# Patient Record
Sex: Female | Born: 1970 | Race: White | Hispanic: No | Marital: Married | Smoking: Never smoker
Health system: Southern US, Community
[De-identification: ages and names within clinical notes are randomized; demographics above are authoritative.]

## PROBLEM LIST (undated history)

## (undated) DIAGNOSIS — M1990 Unspecified osteoarthritis, unspecified site: Secondary | ICD-10-CM

## (undated) DIAGNOSIS — D649 Anemia, unspecified: Secondary | ICD-10-CM

## (undated) DIAGNOSIS — K219 Gastro-esophageal reflux disease without esophagitis: Secondary | ICD-10-CM

## (undated) DIAGNOSIS — D497 Neoplasm of unspecified behavior of endocrine glands and other parts of nervous system: Secondary | ICD-10-CM

## (undated) DIAGNOSIS — G4730 Sleep apnea, unspecified: Secondary | ICD-10-CM

## (undated) DIAGNOSIS — E049 Nontoxic goiter, unspecified: Secondary | ICD-10-CM

## (undated) DIAGNOSIS — E66813 Obesity, class 3: Secondary | ICD-10-CM

## (undated) DIAGNOSIS — J189 Pneumonia, unspecified organism: Secondary | ICD-10-CM

## (undated) DIAGNOSIS — R011 Cardiac murmur, unspecified: Secondary | ICD-10-CM

## (undated) HISTORY — PX: TUBAL LIGATION: SHX77

## (undated) HISTORY — DX: Sleep apnea, unspecified: G47.30

## (undated) HISTORY — DX: Morbid (severe) obesity due to excess calories: E66.01

## (undated) HISTORY — DX: Gastro-esophageal reflux disease without esophagitis: K21.9

## (undated) HISTORY — DX: Anemia, unspecified: D64.9

## (undated) HISTORY — DX: Unspecified osteoarthritis, unspecified site: M19.90

## (undated) HISTORY — DX: Obesity, class 3: E66.813

---

## 2003-03-19 HISTORY — PX: ABDOMINAL HYSTERECTOMY: SHX81

## 2004-05-03 ENCOUNTER — Ambulatory Visit: Payer: Self-pay | Admitting: Family Medicine

## 2004-05-31 ENCOUNTER — Ambulatory Visit: Payer: Self-pay | Admitting: Family Medicine

## 2004-06-05 ENCOUNTER — Ambulatory Visit: Payer: Self-pay | Admitting: Family Medicine

## 2004-07-16 ENCOUNTER — Ambulatory Visit: Payer: Self-pay | Admitting: Family Medicine

## 2006-11-04 ENCOUNTER — Encounter: Admission: RE | Admit: 2006-11-04 | Discharge: 2006-11-04 | Payer: Self-pay | Admitting: Family Medicine

## 2006-12-23 DIAGNOSIS — K219 Gastro-esophageal reflux disease without esophagitis: Secondary | ICD-10-CM

## 2010-01-11 ENCOUNTER — Ambulatory Visit (HOSPITAL_BASED_OUTPATIENT_CLINIC_OR_DEPARTMENT_OTHER): Admission: RE | Admit: 2010-01-11 | Discharge: 2010-01-11 | Payer: Self-pay | Admitting: General Surgery

## 2010-01-11 HISTORY — PX: LIPOMA EXCISION: SHX5283

## 2010-03-18 HISTORY — PX: CHOLECYSTECTOMY: SHX55

## 2010-04-30 ENCOUNTER — Encounter: Payer: Self-pay | Admitting: Internal Medicine

## 2010-05-01 ENCOUNTER — Encounter: Payer: Self-pay | Admitting: Internal Medicine

## 2010-05-03 ENCOUNTER — Encounter: Payer: Self-pay | Admitting: Internal Medicine

## 2010-05-04 ENCOUNTER — Encounter (INDEPENDENT_AMBULATORY_CARE_PROVIDER_SITE_OTHER): Payer: Self-pay | Admitting: *Deleted

## 2010-05-04 ENCOUNTER — Ambulatory Visit (HOSPITAL_COMMUNITY)
Admission: RE | Admit: 2010-05-04 | Discharge: 2010-05-04 | Disposition: A | Discharge status: Home / Self Care | Payer: BC Managed Care – PPO | Source: Ambulatory Visit | Attending: Internal Medicine | Admitting: Internal Medicine

## 2010-05-04 ENCOUNTER — Other Ambulatory Visit: Payer: Self-pay | Admitting: Internal Medicine

## 2010-05-04 ENCOUNTER — Other Ambulatory Visit: Payer: BC Managed Care – PPO

## 2010-05-04 ENCOUNTER — Ambulatory Visit (INDEPENDENT_AMBULATORY_CARE_PROVIDER_SITE_OTHER): Payer: BC Managed Care – PPO | Admitting: Internal Medicine

## 2010-05-04 ENCOUNTER — Encounter: Payer: Self-pay | Admitting: Internal Medicine

## 2010-05-04 DIAGNOSIS — R1011 Right upper quadrant pain: Secondary | ICD-10-CM

## 2010-05-04 DIAGNOSIS — R10816 Epigastric abdominal tenderness: Secondary | ICD-10-CM

## 2010-05-04 DIAGNOSIS — R109 Unspecified abdominal pain: Secondary | ICD-10-CM

## 2010-05-04 DIAGNOSIS — R1013 Epigastric pain: Secondary | ICD-10-CM | POA: Insufficient documentation

## 2010-05-04 LAB — HEPATIC FUNCTION PANEL
ALT: 30 U/L (ref 0–35)
AST: 26 U/L (ref 0–37)
Alkaline Phosphatase: 75 U/L (ref 39–117)
Bilirubin, Direct: 0.1 mg/dL (ref 0.0–0.3)
Total Bilirubin: 0.6 mg/dL (ref 0.3–1.2)
Total Protein: 6.5 g/dL (ref 6.0–8.3)

## 2010-05-04 LAB — CBC WITH DIFFERENTIAL/PLATELET
Basophils Relative: 0.5 % (ref 0.0–3.0)
Eosinophils Relative: 1.2 % (ref 0.0–5.0)
MCV: 90.5 fl (ref 78.0–100.0)
Monocytes Relative: 6.2 % (ref 3.0–12.0)
Neutrophils Relative %: 61.4 % (ref 43.0–77.0)
Platelets: 335 10*3/uL (ref 150.0–400.0)
RBC: 4.22 Mil/uL (ref 3.87–5.11)
WBC: 8.7 10*3/uL (ref 4.5–10.5)

## 2010-05-04 MED ORDER — TECHNETIUM TC 99M MEBROFENIN IV KIT
5.0000 | PACK | Freq: Once | INTRAVENOUS | Status: AC | PRN
  Administered 2010-05-04: 5 via INTRAVENOUS

## 2010-05-05 ENCOUNTER — Telehealth: Payer: Self-pay | Admitting: Internal Medicine

## 2010-05-09 NOTE — Assessment & Plan Note (Signed)
Summary: right upper quad pain sch w [name] bcbs-ins copay and cx fee...   History of Present Illness Visit Type: Initial Visit Primary GI MD: Stan Head MD [facility] Primary Provider: [facility], Dr. Providence Lanius Chief Complaint: RUQ pain x 2 weeks History of Present Illness:   40 yo married ww, spraied ankle severely in November. Got back to normal but hen had recurrent ankle pain and was prescribed meloxicam daily and took that for a week or so. She had increasing heartburn symptoms and pain in RUQ. Also had epigastric pain for a while. Was taking OTC Prevacid ntermittently for heartburn and then two times a day while on meloxicam. She stopped the meloxicam after 1 week. Went to [facility] in last wek. Labs ok, plain xrays ok, US showed fatty liver but no gallstones but no other problems. She was placed on Nexium 40 mg once daily this week (3 days ago). Now still sore in epigastric and RUQ area. She has some relief with eating but then pain returns. She has insomnia so not clear if bothering sleep.  She has not had pain like this befre. She does have known sensitivity ibuprofen, Relafen, NSAIDs. Vicodin made her sleepy last night. Loose stools for last couple of weeks. Mild. Not nocturnal.    GI Review of Systems    Reports abdominal pain, acid reflux, belching, bloating, nausea, and  vomiting.     Location of  Abdominal pain: RUQ.    Denies chest pain, dysphagia with liquids, dysphagia with solids, heartburn, loss of appetite, vomiting blood, weight loss, and  weight gain.      Reports diarrhea.     Denies anal fissure, black tarry stools, change in bowel habit, constipation, diverticulosis, fecal incontinence, heme positive stool, hemorrhoids, irritable bowel syndrome, jaundice, light color stool, liver problems, rectal bleeding, and  rectal pain. Preventive Screening-Counseling & Management      Drug Use:  no.      Current Medications (verified): 1)  Nexium 40 Mg Cpdr (Esomeprazole  Magnesium) .... Take 1 Capsule By Mouth Two Times A Day 2)  Lisinopril 20 Mg Tabs (Lisinopril) .... Take 1 Tablet By Mouth Once Daily 3)  Vicodin 5-500 Mg Tabs (Hydrocodone-Acetaminophen) .... As Needed  Allergies: 1)  ! Pcn 2)  ! Sulfa  Past History:  Past Medical History: Obese GERD Anemia Arthritis Hypertension Sleep Apnea  Past Surgical History: Reviewed history from 12/23/2006 and no changes required. CB x3 Hysterectomy Tubal ligation  Family History: Family History Breast cancer 1st degree relative <50 Family History of Pancreatic Cancer:Uncle Family History of Diabetes: Mother Family History of Irritable Bowel Syndrome:Sister  Social History: Occupation:Teacher - elementary ed in [name] Married 3 grown children Never Smoked Alcohol use-no Daily Caffeine Use : 2-3 diet cokes a day large drinks  Illicit Drug Use - no Drug Use:  no  Review of Systems       right ankle pain - in a boot ad received cortisone injection after problems with meloxiam All other ROS negative except as per HPI.   Vital Signs:  Patient profile:   40 year old female Height:      71 inches Weight:      367.38 pounds BMI:     51.42 Pulse rate:   68 / minute Pulse rhythm:   regular BP sitting:   120 / 70  (left arm) Cuff size:   large  Vitals Entered By: [name] CMA Duncan Dull) (May 04, 2010 8:22 AM)  Physical Exam  General:  obese.  NAD Eyes:  PERRLA, no icterus. Mouth:  No deformity or lesions, dentition normal. Neck:  Supple; no masses or thyromegaly. Lungs:  Clear throughout to auscultation. Heart:  Regular rate and rhythm; no murmurs, rubs,  or bruits. Abdomen:  obese and soft BS+ moderately tender with some guarding in a focal area of RUQ close to epigastrium - has pain with muscle tension also but not worse  no masses Extremities:  right ankle wrapped  no edema Cervical Nodes:  No significant cervical or supraclavicular adenopathy.  Psych:  Alert and  cooperative. Normal mood and affect.  CBC WC 11.9 otherwise ok as is CMET, [place] as above, plain abd films negative Offe ntes reviewed  Impression & Recommendations:  Problem # 1:  RUQ PAIN (ICD-789.01) Assessment New also tender muscle tension does not relieve pain, no rib or xiphoid tenderness and no history of trauma, injury ? cholecystitis, ulcer or gastritis possibe but seems unlikely to be that and only 1 week of Meloxicam and is off x 2 weeks Repeat Labs and get a HIDA scan to ED if worse before  Orders: HIDA Scan (HIDA SCAN) TLB-CBC Platelet - w/Differential (85025-CBCD) TLB-Hepatic/Liver Function Pnl (80076-HEPATIC) TLB-Lipase (83690-LIPASE) TLB-Amylase (82150-AMYL)  Problem # 2:  EPIGASTRIC PAIN (ICD-789.06) Assessment: New  Orders: HIDA Scan (HIDA SCAN) TLB-CBC Platelet - w/Differential (85025-CBCD) TLB-Hepatic/Liver Function Pnl (80076-HEPATIC) TLB-Lipase (83690-LIPASE) TLB-Amylase (82150-AMYL)  Problem # 3:  GERD (ICD-530.81) Assessment: Deteriorated stay on PPI, two times a day temporarily sampled  Patient Instructions: 1)  Take Nexium 1 by mouth two times a day for now. 2)  HIDA Scan [facility] today at 1:30 pm at radiology department park in parking garage and go in main entrance. 3)  Labs ordered for you to have drawn today on basement floor.   4)  Low fat diet brochure given for you to review. 5)  Please go to emergency room if your pain worsens over the weekend. 6)  Copy sent to : [facility], Dr. Providence Lanius 7)  The medication list was reviewed and reconciled.  All changed / newly prescribed medications were explained.  A complete medication list was provided to the patient / caregiver.

## 2010-05-15 NOTE — Progress Notes (Signed)
Summary: HIDA neg, will recall 2/20  Phone Note Outgoing Call   Call placed by: Iva Boop MD, Clementeen Graham,  May 05, 2010 10:59 AM Summary of Call: I relayed normal HIDA still with pain, possibly somewhat better advised call back if significant deterioration or go to ED We will call her 2/20 to reassess how she is - if she says no significant improvement to have CT abdomen (not pevis)with IV contrast to evaluate RUQ and epigastric pain and tenderness. if she is better I will review by 2/21 for further recs Iva Boop MD, [facility]  May 05, 2010 11:01 AM   Follow-up for Phone Call        Left message for patient to call back Darcey Nora RN, [facility]  May 07, 2010 10:08 AM  Patient is feeling better, her heartburn RUQ pain and epigastric pain are all improving.  I have asked her to please call back if this gets worse or not continuing to improve and we will set up CT scan.  Patient verblaized understanding. Follow-up by: Darcey Nora RN, CGRN,  May 07, 2010 11:21 AM  Additional Follow-up for Phone Call Additional follow up Details #1::        ok once finished samples of Nexium for two times a day go to once daily follow-up with pcp for further recs etc please cc PCP on imaging and this note if not done Additional Follow-up by: Iva Boop MD, Clementeen Graham,  May 08, 2010 8:20 AM    Additional Follow-up for Phone Call Additional follow up Details #2::    I have left a detailed message for the patient I have asked ther to please call back if she has any questions or further problems, Follow-up by: Darcey Nora RN, CGRN,  May 08, 2010 9:28 AM

## 2010-05-15 NOTE — Letter (Signed)
Summary: [facility] [place]   Imported By: Lennie Odor 05/07/2010 15:41:49  _____________________________________________________________________  External Attachment:    Type:   Image     Comment:   External Document

## 2010-05-15 NOTE — Letter (Signed)
Summary: [facility] [place]   Imported By: Lennie Odor 05/07/2010 15:41:12  _____________________________________________________________________  External Attachment:    Type:   Image     Comment:   External Document

## 2010-10-11 ENCOUNTER — Encounter: Payer: Self-pay | Admitting: Internal Medicine

## 2010-10-11 ENCOUNTER — Ambulatory Visit (INDEPENDENT_AMBULATORY_CARE_PROVIDER_SITE_OTHER): Payer: BC Managed Care – PPO | Admitting: Internal Medicine

## 2010-10-11 ENCOUNTER — Telehealth: Payer: Self-pay | Admitting: Internal Medicine

## 2010-10-11 ENCOUNTER — Other Ambulatory Visit (INDEPENDENT_AMBULATORY_CARE_PROVIDER_SITE_OTHER): Payer: BC Managed Care – PPO

## 2010-10-11 VITALS — BP 128/70 | HR 76 | Ht 71.0 in | Wt 356.0 lb

## 2010-10-11 DIAGNOSIS — R1013 Epigastric pain: Secondary | ICD-10-CM

## 2010-10-11 DIAGNOSIS — R1011 Right upper quadrant pain: Secondary | ICD-10-CM

## 2010-10-11 DIAGNOSIS — I10 Essential (primary) hypertension: Secondary | ICD-10-CM

## 2010-10-11 LAB — CBC WITH DIFFERENTIAL/PLATELET
Basophils Absolute: 0 10*3/uL (ref 0.0–0.1)
Eosinophils Absolute: 0.1 10*3/uL (ref 0.0–0.7)
HCT: 39.5 % (ref 36.0–46.0)
Hemoglobin: 13.4 g/dL (ref 12.0–15.0)
Lymphs Abs: 1.8 10*3/uL (ref 0.7–4.0)
MCHC: 34 g/dL (ref 30.0–36.0)
MCV: 89 fl (ref 78.0–100.0)
Monocytes Absolute: 0.3 10*3/uL (ref 0.1–1.0)
Monocytes Relative: 4.4 % (ref 3.0–12.0)
Neutro Abs: 5.6 10*3/uL (ref 1.4–7.7)
Platelets: 302 10*3/uL (ref 150.0–400.0)
RDW: 14.6 % (ref 11.5–14.6)

## 2010-10-11 LAB — HEPATIC FUNCTION PANEL
AST: 24 U/L (ref 0–37)
Albumin: 3.7 g/dL (ref 3.5–5.2)

## 2010-10-11 LAB — LIPASE: Lipase: 28 U/L (ref 11.0–59.0)

## 2010-10-11 LAB — AMYLASE: Amylase: 51 U/L (ref 27–131)

## 2010-10-11 NOTE — Progress Notes (Signed)
  Subjective:    Patient ID: [name], female    DOB: 11-20-1970, 40 y.o.   MRN: [id number]  HPI pleasant 40 year old married white woman with recurrent right upper quadrant pain. I saw her in February, it sounded like it was the gallbladder but a HIDA scan was negative. The ejection fraction was not obtained for a miscommunication. Previous abdominal ultrasound did not show stones. She improved, probably did not resolve but quality of life is okay. Then she was away in [place] recently, and developed acute worsening of her right upper quadrant pain again. She says it began in the right infrascapular region in the posterior aspect. Fairly severe and intense associated with nausea and some loose stools. She did not vomit. It did not seem positional. She could not really tell any particular trigger. She has improved. She saw her primary-care physician where a CT of the abdomen was performed without contrast due to prior allergy and it showed mild hepatomegaly, and a small periumbilical fascial defect fat in it. She had been swimming some in the pool at the hotel in [place] prior to this. Says is very similar or identical to the problem she saw me for in February. She had not been maintained on a PPI but has restarted one recently.    Review of Systems No urinary symptoms reported. No fevers.    Objective:   Physical Exam Obese NAD Lungs clear Heart s1 s2 no murmur abd tender in RUQ and ribs, same with muscle tension but no pain with rotation/twisting of her trunk Mildly anxious       Assessment & Plan:

## 2010-10-11 NOTE — Telephone Encounter (Signed)
Patient does have an appt today.  I rescheduled the wrong patient on the schedule.  The patient will return to the office asap

## 2010-10-11 NOTE — Patient Instructions (Addendum)
Please go to the basement upon leaving today to have your labs done. You have been scheduled for an Abdominal Ultrasound at [facility] on Friday 10/12/10 @ 9:30 am. Please arrive @ 9:15 am at the Radiology Department on the 1st floor. Please have nothing to eat or drink after midnight the night before.

## 2010-10-12 ENCOUNTER — Ambulatory Visit (HOSPITAL_COMMUNITY)
Admission: RE | Admit: 2010-10-12 | Discharge: 2010-10-12 | Disposition: A | Discharge status: Home / Self Care | Payer: BC Managed Care – PPO | Source: Ambulatory Visit | Attending: Internal Medicine | Admitting: Internal Medicine

## 2010-10-12 DIAGNOSIS — E669 Obesity, unspecified: Secondary | ICD-10-CM | POA: Insufficient documentation

## 2010-10-12 DIAGNOSIS — I10 Essential (primary) hypertension: Secondary | ICD-10-CM | POA: Insufficient documentation

## 2010-10-12 DIAGNOSIS — R1011 Right upper quadrant pain: Secondary | ICD-10-CM

## 2010-10-12 NOTE — Assessment & Plan Note (Signed)
Not so much this today

## 2010-10-12 NOTE — Assessment & Plan Note (Signed)
She is aware of her problem and is working on it and has lost weight. I explained that the most definitive and effective, long-lasting treatment is bariatric surgery. She will continue efforts to lose weight though at her BMI, bariatric surgery is a reasonable option.

## 2010-10-12 NOTE — Assessment & Plan Note (Signed)
This is recurred. It sounds biliary in origin though there is some musculoskeletal type tenderness on exam. I can't really tell the cause of her pain at this point. It does not sound like an ulcer or peptic problem or even reflux to me. We'll go ahead with labs and a repeat ultrasound of her body habitus may cause difficulty interpreting the ultrasound. I think a surgical consultation to take her history and see if they think this is a biliary cause of pain. We could go so far as to get an MRI and MRCP if needed. Repeating a HIDA scan with ejection fraction is also possible as is an EGD. Explained that her obesity is an issue re: possible surgery though much of hers is hip-waist.  The labs did return normal with normal CBC and LFTs, amylase and lipase normal also. Think that makes cholecystitis less likely but certainly doesn't rule out a chronic cholecystitis or gallstone problem. Unfortunately objective data to support a biliary diagnosis is lacking at this time. We'll see what the ultrasound shows.

## 2010-10-15 NOTE — Progress Notes (Signed)
Quick Note:  Let her know no gallstones Fatty liver may have some role as cause but not clear Awaiting surgical consult ______

## 2010-10-15 NOTE — Progress Notes (Signed)
Quick Note:  Labs ok Awaiting surgical consult ______

## 2010-10-17 ENCOUNTER — Encounter (INDEPENDENT_AMBULATORY_CARE_PROVIDER_SITE_OTHER): Payer: Self-pay | Admitting: General Surgery

## 2010-10-17 ENCOUNTER — Ambulatory Visit (INDEPENDENT_AMBULATORY_CARE_PROVIDER_SITE_OTHER): Payer: BC Managed Care – PPO | Admitting: General Surgery

## 2010-10-17 VITALS — BP 132/88 | HR 52 | Temp 97.4°F | Ht 71.0 in | Wt 355.8 lb

## 2010-10-17 DIAGNOSIS — R1011 Right upper quadrant pain: Secondary | ICD-10-CM

## 2010-10-17 NOTE — Progress Notes (Signed)
[name] is a 40 y.o. female.    Chief Complaint  Patient presents with  . Other    new pt- eval GB     HPI HPI 40 year old morbidly obese Caucasian female referred by Dr. Stan Head for evaluation of right upper quadrant abdominal pain. She states that she has had ongoing right-sided upper abdominal pain since February. It is associated with nausea and belching. It will occur at various times during the day. She has some chronic persistent ongoing discomfort in that area; however, it will definitely worsen at certain times. She notices that it'll increase in severity generally in the late afternoon or early evening. She cannot relate any particular foods. She denies any vomiting, fever, chills, weight loss, or jaundice. She denies any dysphasia. She denies any clay-colored stools. She has had some loose stools over the past couple months. The pain will radiate to her back. She has undergone abdominal ultrasound as well as a HIDA scan without an ejection fraction. She denies any trauma to that area. She had a severe episode when she was in [place] about a month ago. That discomfort lasted for several hours.  Past Medical History  Diagnosis Date  . GERD (gastroesophageal reflux disease)   . Anemia   . Arthritis   . HTN (hypertension)   . Sleep apnea   . Obesity, Class III, BMI 40-49.9 (morbid obesity)     Past Surgical History  Procedure Date  . Abdominal hysterectomy 03/2003    partial  . Tubal ligation   . Lipoma excision 01/11/2010    Family History  Problem Relation Age of Onset  . Breast cancer    . Pancreatic cancer      uncle  . Diabetes Mother   . Cancer Mother     breast  . Irritable bowel syndrome Sister     Social History History  Substance Use Topics  . Smoking status: Never Smoker   . Smokeless tobacco: Never Used  . Alcohol Use: No    Allergies  Allergen Reactions  . Contrast Media (Iodinated Diagnostic Agents)   . Penicillins   . Sulfonamide  Derivatives     Current Outpatient Prescriptions  Medication Sig Dispense Refill  . acetaminophen (TYLENOL) 500 MG tablet Take 500 mg by mouth every 6 (six) hours as needed.        . lansoprazole (PREVACID) 15 MG capsule Take 15 mg by mouth daily. As needed       . lisinopril (PRINIVIL,ZESTRIL) 20 MG tablet Take 20 mg by mouth daily.        Marland Kitchen HYDROcodone-acetaminophen (VICODIN) 5-500 MG per tablet Take 1 tablet by mouth as needed.          Review of Systems Review of Systems  Constitutional: Negative for fever, chills and weight loss.  HENT:       Glasses  Eyes: Negative for blurred vision and double vision.  Respiratory: Negative for sputum production.   Cardiovascular: Negative for chest pain, palpitations, orthopnea, leg swelling and PND.  Gastrointestinal: Negative for blood in stool and melena.       See hpi  Genitourinary: Negative for dysuria and hematuria.  Musculoskeletal:       Rt foot pain  Skin: Negative for itching and rash.  Neurological: Negative for dizziness, tingling and headaches.  Endo/Heme/Allergies: Does not bruise/bleed easily.  Psychiatric/Behavioral: Negative.     Physical Exam Physical Exam  Vitals reviewed. Constitutional: She is oriented to person, place, and time. She appears well-developed  and well-nourished.       Morbid obesity; pear shaped  HENT:  Head: Normocephalic and atraumatic.  Eyes: Conjunctivae are normal. No scleral icterus.  Neck: Normal range of motion. Neck supple. No tracheal deviation present.  Cardiovascular: Normal rate, regular rhythm and normal heart sounds.   Respiratory: Effort normal and breath sounds normal. No respiratory distress. She has no wheezes.  GI: Soft. Bowel sounds are normal. She exhibits no mass. There is tenderness (ruq TTP). There is no rebound and no guarding. No hernia.    Musculoskeletal: Normal range of motion.  Lymphadenopathy:    She has no cervical adenopathy.  Neurological: She is alert and  oriented to person, place, and time.  Skin: Skin is warm and dry.       No edema  Psychiatric: She has a normal mood and affect. Her behavior is normal. Judgment and thought content normal.     Blood pressure 132/88, pulse 52, temperature 97.4 F (36.3 C), height 5\' 11"  (1.803 m), weight 355 lb 12.8 oz (161.39 kg).  Data reviewed: I reviewed Dr. Izola Price note  NUCLEAR MEDICINE HEPATOBILIARY IMAGING  Technique: Sequential images of the abdomen were obtained out to  60 minutes following intravenous administration of  radiopharmaceutical.   Radiopharmaceutical: 5.0 mCi Tc-79m Choletec   Comparison: None.   Findings: The patient was injected with 5.0 mCi of technetium 64m  Choletec intravenously and imaging over the upper abdomen was  performed. The radionuclide is noted throughout the liver. There  is excretion into the intrahepatic ductal system. There is  visualization of the common bile duct, small bowel, and  gallbladder. This represents a normal nuclear medicine  hepatobiliary scan.   IMPRESSION:  Normal hepatobiliary scan.  COMPLETE ABDOMINAL ULTRASOUND  Comparison: None.   Findings:   Gallbladder: The gallbladder is visualized and no gallstones are  noted. There is no pain over the gallbladder with compression   Common bile duct: The common bile duct is normal measuring 3.3 mm  in diameter.   Liver: The liver is mildly echogenic which may represent mild  fatty infiltration. Two small echogenic structures are present in  the right lobe most consistent with hemangiomas with the larger  measuring 2.1 x 1.1 x 1.1 cm and the smaller measuring 0.9 x 0.9 x  1.0 cm.   IVC: Appears normal.   Pancreas: The tail of the pancreas is obscured by bowel gas view   Spleen: It the spleen is normal measuring 11.9 cm sagittally.   Right Kidney: No hydronephrosis is seen. The right kidney  measures 13.2 cm sagittally.   Left Kidney: No hydronephrosis. The left kidney  measures 14.7 cm.   Abdominal aorta: The abdominal aorta is normal in caliber.   This study is somewhat compromised by large patient body habitus.   IMPRESSION:  1. No gallstones.  2. Mild increased echogenicity of the liver may indicate fatty  infiltration. Two small echogenic foci in the right lobe are  consistent with small hemangiomas.  3. The pancreas is partially obscured by bowel gas.  4. Somewhat prominent kidneys. No hydronephrosis.   Assessment/Plan 40 year old morbidly obese Caucasian female with hypertension, obstructive sleep apnea, fatty liver disease, and right upper quadrant pain.  This does not sound like musculoskeletal to me. There are some components that suggest a biliary etiology. Her ultrasound was somewhat limited due to her body habitus so it's possible she may have gallstones that were missed. Although her HIDA scan was normal there was no ejection  fraction calculated.  We discussed several options. We discussed a repeat HIDA scan with an ejection fraction, upper endoscopy, or proceeding to surgery for laparoscopic cholecystectomy. If we repeat a HIDA scan and it is still normal we will still be left with right upper quadrant pain. Her symptoms do not really suggest reflux disease. Therefore I offered the patient a laparoscopic cholecystectomy. I did explain to her that there is a possibility that cholecystectomy may not ameliorate her abdominal pain. However she has elected to proceed to the operating room.  I explained to her that she is slightly increased risk for pulmonary complications given her morbid obesity. I also explained that she is at slightly higher risk for blood clot formation given her low-grade obesity. Therefore we will give her a dose of subcutaneous heparin preoperatively.  I discussed the procedure in detail.  The patient was given Agricultural engineer.  We discussed the risks and benefits of a laparoscopic cholecystectomy including, but not  limited to bleeding, infection, injury to surrounding structures such as the intestine or liver, bile leak, retained gallstones, need to convert to an open procedure, prolonged diarrhea, blood clots such as  DVT, common bile duct injury, anesthesia risks, and possible need for additional procedures.  We discussed the typical post-operative recovery course.  Gaynelle Adu M 10/17/2010, 11:09 AM

## 2010-10-17 NOTE — Patient Instructions (Signed)
Biliary Colic  Biliary colic is a steady or irregular pain in the upper abdomen. It is usually under the right side of the rib cage. It happens when gallstones interfere with the normal flow of bile from the gallbladder. Bile is a liquid that helps to digest fats. Bile is made in the liver and stored in the gallbladder. When you eat a meal, bile passes from the gallbladder through the cystic duct and the common bile duct into the small intestine. There, it mixes with partially digested food. If a gallstone blocks either of these ducts, the normal flow of bile is blocked. The muscle cells in the bile duct contract forcefully to try to move the stone. This causes the pain of biliary colic.  SYMPTOMS  A person with biliary colic usually complains of pain in the upper abdomen. This pain can be:   In the center of the upper abdomen just below the breastbone.   In the upper-right part of the abdomen, near the gallbladder and liver.   Spread back toward the right shoulder blade.   Nausea and vomiting.   The pain usually occurs after eating.   Biliary colic is usually triggered by the digestive system's demand for bile. The demand for bile is high after fatty meals. Symptoms can also occur when a person who has been fasting suddenly eats a very large meal. Most episodes of biliary colic pass after one to five hours. After the most intense pain passes, your abdomen may continue to ache mildly for about 24 hours.  DIAGNOSIS After you describe your symptoms, your caregiver will perform a physical exam. He or she will pay attention to the upper right portion of your belly (abdomen). This is the area of your liver and gall bladder. An ultrasound will help your caregiver look for gallstones. Specialized scans of the gallbladder may also be done. Blood tests may be done, especially if you have fever or if your pain persists. PREVENTION Biliary colic can be prevented by controlling the risk factors for  gallstones. Some of these risk factors, such as heredity, increasing age and pregnancy are a normal part of life. Obesity and a high-fat diet are risk factors you can change through a healthy lifestyle. Women going through menopause who take hormone replacement therapy (estrogen) are also more likely to develop biliary colic. TREATMENT  Pain medication may be prescribed.   You may be encouraged to eat a fat-free diet.   If the first episode of biliary colic is severe, or episodes of colic keep retuning, surgery to remove the gallbladder (cholecystectomy) is usually recommended. This procedure can be done through small incisions using an instrument called a laparoscope. The procedure often requires a brief stay in the hospital. Some people can leave the hospital the same day. It is the most widely used treatment in people troubled by painful gallstones. It is effective and safe, with no complications in more than 90% of cases.   If surgery cannot be done, medication that dissolves gallstones may be used. This medication is expensive and can take months or years to work. Only small stones will dissolve.   Rarely, medication to dissolve gallstones is combined with a procedure called shock-wave lithotripsy. This procedure uses carefully aimed shock waves to break up gallstones. In many people treated with this procedure, gallstones form again within a few years.  PROGNOSIS If gallstones block your cystic duct or common bile duct, you are at risk for repeated episodes of biliary colic. There is   also a 25% chance that you will develop acute cholecystitis, or some other complication of gallstones within 10 to 20 years. If you have surgery, schedule it at a time that is convenient for you and at a time when you are not sick. HOME CARE INSTRUCTIONS  Drink plenty of clear fluids.   Avoid fatty, greasy or fried foods, or any foods that make your pain worse.   Take medications as directed.  SEEK MEDICAL  CARE IF:  You develop a fever over 100.5 F (38.1 C).   Your pain gets worse over time.   You develop nausea that prevents you from eating and drinking.   You develop vomiting.  SEEK IMMEDIATE MEDICAL CARE IF:  You have continuous or severe belly (abdominal) pain which is not relieved with medications.   You develop nausea and vomiting which is not relieved with medications.   You have symptoms of biliary colic and you suddenly develop a fever and shaking chills. This may signal a gallbladder infection (cholecystitis). Call your caregiver immediately.   You develop a yellow color to your skin or the white part of your eyes (jaundice).  Document Released: 08/05/2005 Document Re-Released: 12/12/2007 [employer] Patient Information 2011 [employer]. 

## 2010-10-18 ENCOUNTER — Other Ambulatory Visit (HOSPITAL_COMMUNITY): Payer: BC Managed Care – PPO

## 2010-10-19 ENCOUNTER — Other Ambulatory Visit (INDEPENDENT_AMBULATORY_CARE_PROVIDER_SITE_OTHER): Payer: Self-pay | Admitting: General Surgery

## 2010-10-19 ENCOUNTER — Encounter (HOSPITAL_COMMUNITY): Payer: BC Managed Care – PPO

## 2010-10-19 ENCOUNTER — Ambulatory Visit (HOSPITAL_COMMUNITY)
Admission: RE | Admit: 2010-10-19 | Discharge: 2010-10-19 | Disposition: A | Discharge status: Home / Self Care | Payer: BC Managed Care – PPO | Source: Ambulatory Visit | Attending: General Surgery | Admitting: General Surgery

## 2010-10-19 DIAGNOSIS — K829 Disease of gallbladder, unspecified: Secondary | ICD-10-CM | POA: Insufficient documentation

## 2010-10-19 DIAGNOSIS — Z01812 Encounter for preprocedural laboratory examination: Secondary | ICD-10-CM | POA: Insufficient documentation

## 2010-10-19 DIAGNOSIS — R52 Pain, unspecified: Secondary | ICD-10-CM

## 2010-10-19 DIAGNOSIS — Z01818 Encounter for other preprocedural examination: Secondary | ICD-10-CM | POA: Insufficient documentation

## 2010-10-19 DIAGNOSIS — Z01810 Encounter for preprocedural cardiovascular examination: Secondary | ICD-10-CM | POA: Insufficient documentation

## 2010-10-19 DIAGNOSIS — R9431 Abnormal electrocardiogram [ECG] [EKG]: Secondary | ICD-10-CM | POA: Insufficient documentation

## 2010-10-19 LAB — DIFFERENTIAL
Basophils Absolute: 0 10*3/uL (ref 0.0–0.1)
Basophils Relative: 0 % (ref 0–1)
Monocytes Absolute: 0.5 10*3/uL (ref 0.1–1.0)
Neutro Abs: 5.1 10*3/uL (ref 1.7–7.7)

## 2010-10-19 LAB — COMPREHENSIVE METABOLIC PANEL
ALT: 20 U/L (ref 0–35)
Albumin: 3.3 g/dL — ABNORMAL LOW (ref 3.5–5.2)
Alkaline Phosphatase: 77 U/L (ref 39–117)
Chloride: 104 mEq/L (ref 96–112)
Glucose, Bld: 87 mg/dL (ref 70–99)
Potassium: 3.7 mEq/L (ref 3.5–5.1)
Sodium: 137 mEq/L (ref 135–145)
Total Protein: 7.1 g/dL (ref 6.0–8.3)

## 2010-10-19 LAB — CBC
Hemoglobin: 12.5 g/dL (ref 12.0–15.0)
MCHC: 33.5 g/dL (ref 30.0–36.0)
RDW: 14.5 % (ref 11.5–15.5)
WBC: 8.1 10*3/uL (ref 4.0–10.5)

## 2010-10-23 ENCOUNTER — Other Ambulatory Visit (INDEPENDENT_AMBULATORY_CARE_PROVIDER_SITE_OTHER): Payer: Self-pay | Admitting: General Surgery

## 2010-10-23 ENCOUNTER — Ambulatory Visit (HOSPITAL_COMMUNITY): Payer: BC Managed Care – PPO

## 2010-10-23 ENCOUNTER — Ambulatory Visit (HOSPITAL_COMMUNITY)
Admission: RE | Admit: 2010-10-23 | Discharge: 2010-10-24 | Disposition: A | Discharge status: Home / Self Care | Payer: BC Managed Care – PPO | Source: Ambulatory Visit | Attending: General Surgery | Admitting: General Surgery

## 2010-10-23 DIAGNOSIS — K8020 Calculus of gallbladder without cholecystitis without obstruction: Secondary | ICD-10-CM

## 2010-10-23 DIAGNOSIS — G4730 Sleep apnea, unspecified: Secondary | ICD-10-CM | POA: Insufficient documentation

## 2010-10-23 DIAGNOSIS — I498 Other specified cardiac arrhythmias: Secondary | ICD-10-CM

## 2010-10-23 DIAGNOSIS — Z01810 Encounter for preprocedural cardiovascular examination: Secondary | ICD-10-CM | POA: Insufficient documentation

## 2010-10-23 DIAGNOSIS — K824 Cholesterolosis of gallbladder: Secondary | ICD-10-CM

## 2010-10-23 DIAGNOSIS — K811 Chronic cholecystitis: Secondary | ICD-10-CM | POA: Insufficient documentation

## 2010-10-23 DIAGNOSIS — Z01812 Encounter for preprocedural laboratory examination: Secondary | ICD-10-CM | POA: Insufficient documentation

## 2010-10-23 DIAGNOSIS — Z79899 Other long term (current) drug therapy: Secondary | ICD-10-CM | POA: Insufficient documentation

## 2010-10-23 DIAGNOSIS — I10 Essential (primary) hypertension: Secondary | ICD-10-CM | POA: Insufficient documentation

## 2010-10-23 DIAGNOSIS — R1011 Right upper quadrant pain: Secondary | ICD-10-CM | POA: Insufficient documentation

## 2010-10-23 DIAGNOSIS — Z01818 Encounter for other preprocedural examination: Secondary | ICD-10-CM | POA: Insufficient documentation

## 2010-10-23 LAB — CBC
HCT: 37.5 % (ref 36.0–46.0)
Hemoglobin: 12.2 g/dL (ref 12.0–15.0)
MCH: 28.8 pg (ref 26.0–34.0)
MCHC: 32.5 g/dL (ref 30.0–36.0)
MCV: 88.4 fL (ref 78.0–100.0)

## 2010-10-23 LAB — BASIC METABOLIC PANEL
BUN: 10 mg/dL (ref 6–23)
CO2: 22 mEq/L (ref 19–32)
Chloride: 102 mEq/L (ref 96–112)
Glucose, Bld: 151 mg/dL — ABNORMAL HIGH (ref 70–99)
Potassium: 3.9 mEq/L (ref 3.5–5.1)

## 2010-10-24 ENCOUNTER — Telehealth (INDEPENDENT_AMBULATORY_CARE_PROVIDER_SITE_OTHER): Payer: Self-pay | Admitting: General Surgery

## 2010-10-24 DIAGNOSIS — I369 Nonrheumatic tricuspid valve disorder, unspecified: Secondary | ICD-10-CM

## 2010-10-24 LAB — TSH: TSH: 0.45 u[IU]/mL (ref 0.350–4.500)

## 2010-10-24 NOTE — Telephone Encounter (Signed)
Spoke with patient. Patient states was told to stop her lisinopril because her blood pressure was getting too low. Patient's blood pressure is reading in the normal range now. She would like to know when to start back on her lisinopril. I told patient not to take it now since her blood pressure was normal because we wouldn't want to drop it below normal and I advised I would address with Dr Andrey Campanile, but he may defer to PCP.

## 2010-10-24 NOTE — Telephone Encounter (Signed)
She can resume her lisinopril per the cardiologist that saw her in the hospital Dr Patty Sermons.  She needs to follow-up with her PCP in 1-2 weeks.

## 2010-10-25 NOTE — Consult Note (Signed)
[name], [name] NO.:  [id number]  MEDICAL RECORD NO.:  [id number]  LOCATION:  1439                         FACILITY:  [facility]  PHYSICIAN:  Cassell Clement, M.D. DATE OF BIRTH:  13-Feb-1971  DATE OF CONSULTATION:  10/23/2010 DATE OF DISCHARGE:                                CONSULTATION   HISTORY OF PRESENT ILLNESS:  I was asked by Dr. Andrey Campanile to see this pleasant 40 year old married Caucasian female because of marked bradycardia.  The patient had a successful lap cholecystectomy earlier today.  Postoperatively, she has been running sinus bradycardia in the rate of 40.  She has not been experiencing any symptoms of dizziness or syncope.  She does not have any past history of known heart disease, but has had a history of mild essential hypertension and has been on low- dose lisinopril once a day.  Of note is the fact that her preoperative electrocardiogram taken on October 19, 2010 showed a preop heart rate of 37 beats per minute, and an electrocardiogram taken this afternoon shows postoperatively that her heart rate is 40.  The patient denies any history of exertional chest pain.  She has occasional exertional dyspnea.  She has had a rare awareness of palpitations.  She has had no history of dizziness or syncope.  She does have history of mild sleep apnea.  She is not diabetic.  ALLERGIES:  She is allergic to PENICILLIN and SULFA.  HOME MEDICATIONS:  Lisinopril, uncertain dose once a day; Tylenol p.r.n.; and Prilosec over-the-counter p.r.n.  SOCIAL HISTORY:  Reveals that she is married.  She has 3 children.  She does not use alcohol or tobacco.  She is a Museum/gallery curator.  FAMILY HISTORY:  Reveals that her father died at age 66 of an accident. Her mother is alive at 81 and has a history of breast cancer and adult- onset diabetes.  REVIEW OF SYSTEMS:  Negative except for known history of goiter and history of some cold intolerance.  She has had  her thyroid levels checked in the past but not within the past year, and she does have a history of cold intolerance and a goiter.  PHYSICAL EXAMINATION:  VITAL SIGNS:  Blood pressure 102/42 and pulse is 51. GENERAL APPEARANCE:  A well-developed and well-nourished woman in no distress. SKIN:  Clear.  Unremarkable. HEAD AND NECK:  Unremarkable.  Pupils equal and reactive to light. Extraocular movements are full.  The jugular venous pressure is normal. Carotids are normal.  No carotid bruits.  The patient does have a smooth goiter present. CHEST:  Clear to percussion and auscultation. HEART:  Reveals no murmur, gallop, rub, or click. ABDOMEN:  Soft and nontender. EXTREMITIES:  No phlebitis or edema.  Pedal pulses are present. NEUROLOGIC:  Physiologic.  LABORATORY DATA:  Her electrocardiogram shows sinus bradycardia and no ischemic changes and is unchanged from the preop tracing.  Her preoperative chest x-ray showed normal heart size and clear lung fields.  Her preoperative lab work was satisfactory.  IMPRESSION: 1. Sinus bradycardia which appears to be chronic and is essentially     unchanged from the preoperative heart rate of 37. 2. Possible mild hypothyroidism with presence of  a goiter and history     of cold intolerance. 3. Mild essential hypertension.  RECOMMENDATIONS:  Observe overnight on telemetry.  Check thyroid and basic labs tonight.  Watch blood pressure overnight and if stable, her lisinopril could be restarted in the morning prior to discharge.  We will get a 2-dimensional echocardiogram early Wednesday to look at LV function.  If thyroid function and echo are negative, she can probably go home later tomorrow.  At this point, there is no need for atropine. There is no indication for pacemaker etc.  Thank you for the opportunity to see this pleasant woman with you.          ______________________________ Cassell Clement, M.D.     TB/MEDQ  D:  10/23/2010   T:  10/24/2010  Job:  [id number]  cc:   Mary Sella. Andrey Campanile, MD [address] [place] [address]  Electronically Signed by Cassell Clement M.D. on 10/25/2010 08:54:23 AM

## 2010-10-25 NOTE — Telephone Encounter (Signed)
Called patient and made her aware she could restart her lisinopril but to follow up with her PCP in 1-2 weeks.

## 2010-10-26 NOTE — Op Note (Signed)
[name], [name]              ACCOUNT NO.:  [id number]  MEDICAL RECORD NO.:  [id number]  LOCATION:  1439                         FACILITY:  [facility]  PHYSICIAN:  Mary Sella. Andrey Campanile, MD     DATE OF BIRTH:  28-May-1970  DATE OF PROCEDURE: DATE OF DISCHARGE:                              OPERATIVE REPORT   PREOPERATIVE DIAGNOSIS:  Right upper quadrant pain.  POSTOPERATIVE DIAGNOSIS:  Right upper quadrant pain.  PROCEDURE:  Laparoscopic cholecystectomy with intraoperative cholangiogram.  SURGEON:  Mary Sella. Andrey Campanile, MD  ASSISTANT SURGEON:  Anselm Pancoast. Zachery Dakins, M.D.  ANESTHESIA:  General plus Marcaine with epinephrine.  SPECIMEN:  Gallbladder.  ESTIMATED BLOOD LOSS:  Minimal.  FINDINGS:  A critical view was obtained.  The cholangiogram was within normal limits.  There was prompt opacification of the cystic duct, common hepatic, common bile duct, and left to right hepatic ducts.  The contrast probably emptied into the duodenum without any filling defects.  INDICATIONS FOR PROCEDURE:  The patient is a morbidly obese Caucasian female who has had intermittent right upper quadrant pain since February.  It is associated with nausea and belching.  It will last for several hours.  Her HIDA scan demonstrated no evidence of chronic or acute cholecystitis and ultrasound showed no evidence of gallstones, but based on her symptoms and her story felt it was consistent with biliary disease.  We discussed the risks and benefits of laparoscopic cholecystectomy including but not limited to bleeding, infection, injury to surrounding structures, bile leak, prolonged diarrhea, need to convert to an open procedure, hernia formation, injury to the common bile duct requiring major reconstructive bile duct surgery and blood clot formation and general anesthesia risks as well as failure to ameliorate her abdominal pain.  She elects to proceed to surgery.  DESCRIPTION OF PROCEDURE:  The patient was given  5000 units of subcutaneous heparin prior to going to surgery because of her morbid obesity.  She was then taken to the operating room and placed supine on operating room table.  General endotracheal anesthesia was established. Sequential compression devices were placed.  She received ciprofloxacin prior to skin incision.  Her abdomen was prepped and draped in usual standard surgical fashion.  A surgical time-out was performed.  I elected to gain entry to her abdomen above the level of umbilicus. Local was infiltrated at the base of the umbilicus.  Next, a vertical 1- inch supraumbilical incision was made in the midline with #11 blade. Subcutaneous tissue was spread.  The fascia lifted anteriorly.  Next, the fascia was incised with a #11 blade and the abdominal cavity was entered.  A pursestring suture was placed around the fascial edges consisting of 0 Vicryl.  A 12-mm on trocar was placed into the abdominal cavity.  Pneumoperitoneum was smoothly established up to a patient pressure of 15 mmHg.  Laparoscope was advanced and there was no evidence of injury to surrounding structures.  She was placed in reversed Trendelenburg and rotated slightly to the left.  I placed three 5-mm trocars in the upper abdomen, one of the subxiphoid and two in the right hypochondrium, all under direct visualization after local been infiltrated.  The gallbladder was grasped and  retracted toward the right shoulder.  She had a very large right lobe.  The body was grasped and retracted laterally.  I then incised the peritoneum both medially and laterally off the gallbladder using hook electrocautery.  I identified the cystic duct and circumferentially dissected it out and around with aid of a Teaching laboratory technician.  The cystic artery was also identified.  The critical view was obtained.  The cystic artery had an anterior and posterior branch. I elected to ligate the anterior branch with two clips on the  downside and one clip next to the gallbladder.  It was then transected.  This opened up the window a little bit better for me to get a clip on the cystic duct as it entered the gallbladder.  It was partially transected with Endoshears.  The [facility] cholangiogram catheter was percutaneously advanced through the abdominal wall and threaded into the cystic duct and secured with a clip.  The cholangiogram was performed with the results as described above.  Once the cholangiogram was complete, we reestablished pneumoperitoneum, placed her back in the operating position with a clip securing the cholangiogram catheter and discarded the cholangiogram catheter.  Three clips were placed in the biliary side and the cystic duct and then transected with Endoshears.  I then placed two clips in the downside of the posterior branch of the cystic artery and one distal as it entered the gallbladder.  It was then transected.  I then mobilized the gallbladder up out the gallbladder fossa using hook electrocautery thus freeing the gallbladder.  The camera was placed in the subxiphoid trocar and an endobag was placed through the umbilical trocar.  The gallbladder was placed within the bag and extracted from the abdominal cavity.  Pneumoperitoneum was reestablished by replacing this on trocar. We reinspected the right upper quadrant.  There was no evidence of bleeding or bile leak from the gallbladder fossa.  The clips were securely across the ductal structure as well as across the cystic artery remnant.  I removed Hassan trocar tied down the previously placed pursestring suture at umbilicus.  There was no air leak.  Given her obesity, I now elected to place another figure-of-eight 0 Vicryl suture around the fascial defect.  This was done and viewed laparoscopically. Peritoneum was released.  Remaining trocars were removed.  All skin incisions were closed with 4-0 Monocryl in a subcuticular fashion, followed by  application of Benzoin, Steri-Strips, and sterile bandages.  All needle and instrument counts were correct x2. There were no immediate complications.  The patient tolerated the procedure.     Mary Sella. Andrey Campanile, MD     EMW/MEDQ  D:  10/23/2010  T:  10/24/2010  Job:  [id number]  cc:   Iva Boop, MD,FACG [facility] [address] [place], [place] [address]  Devra Dopp, MD  Electronically Signed by Gaynelle Adu M.D. on 10/26/2010 10:03:31 AM

## 2010-10-31 ENCOUNTER — Telehealth: Payer: Self-pay | Admitting: Cardiology

## 2010-10-31 NOTE — Telephone Encounter (Signed)
Call Back Phone#: [phone] Last OV

## 2010-10-31 NOTE — Telephone Encounter (Signed)
Looked the patient up and they had never been seen by one of our Physicians. Called back and notified the person requesting the info.

## 2010-11-05 ENCOUNTER — Ambulatory Visit: Payer: BC Managed Care – PPO | Admitting: Internal Medicine

## 2010-11-23 ENCOUNTER — Encounter (INDEPENDENT_AMBULATORY_CARE_PROVIDER_SITE_OTHER): Payer: BC Managed Care – PPO | Admitting: General Surgery

## 2010-12-13 ENCOUNTER — Encounter (INDEPENDENT_AMBULATORY_CARE_PROVIDER_SITE_OTHER): Payer: Self-pay | Admitting: General Surgery

## 2012-02-08 IMAGING — US US ABDOMEN COMPLETE
1 series · 13 of 25 positions shown · non-contrast
Comparison: None.

CLINICAL DATA: Right upper quadrant pain, hypertension, obesity

COMPLETE ABDOMINAL ULTRASOUND

[Series 1: us abdomen complete · 0.32mm/px · 13 of 99 slices shown]
[im 1/99]
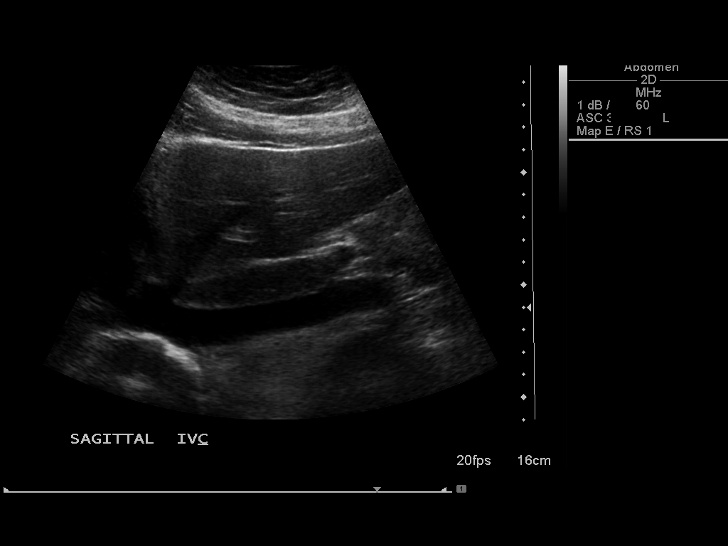
[im 9/99]
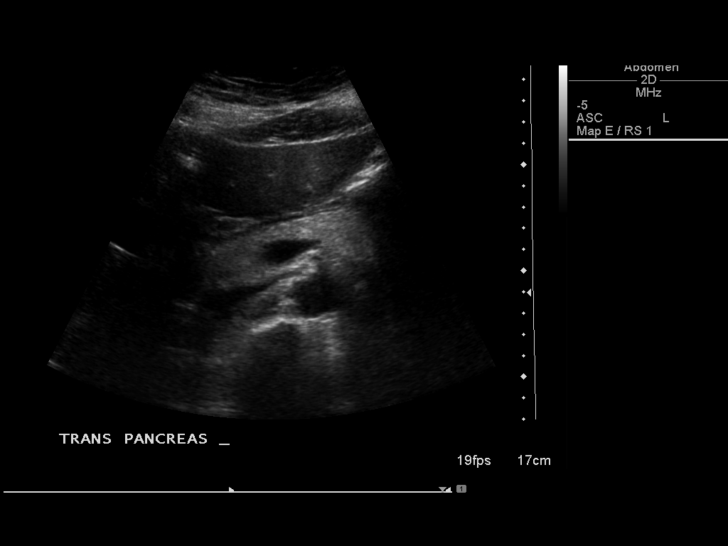
[im 17/99]
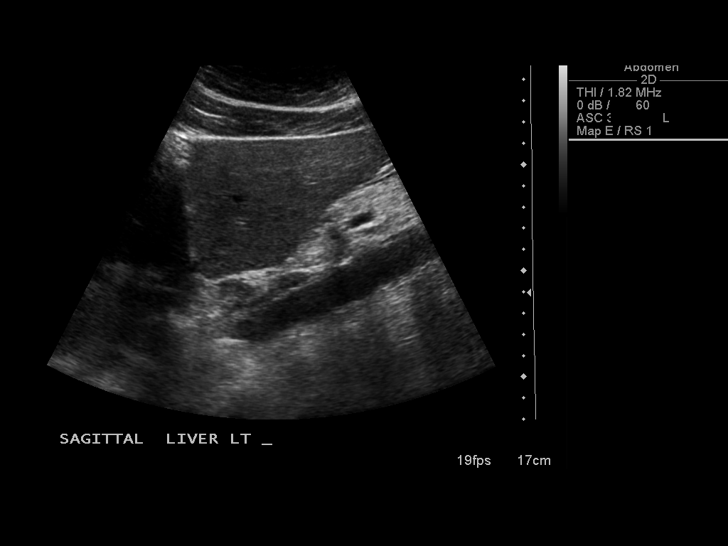
[im 25/99]
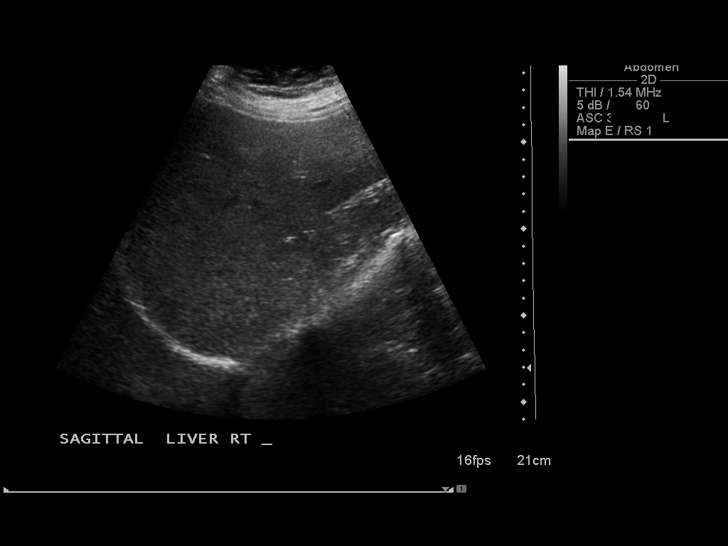
[im 33/99]
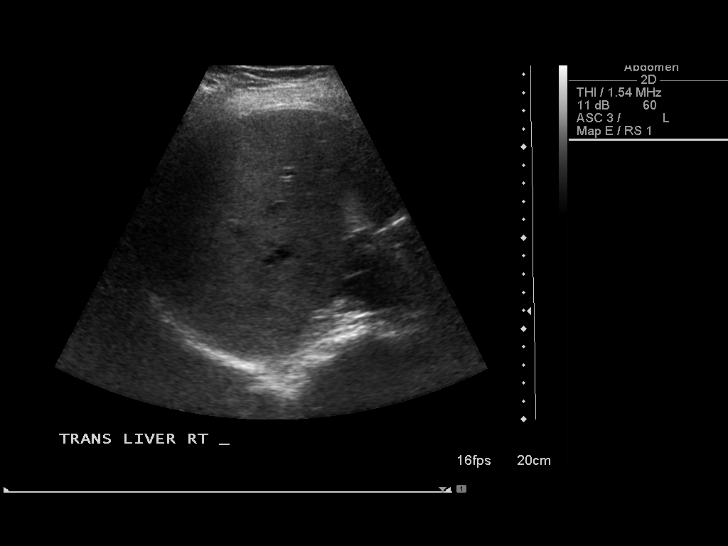
[im 41/99]
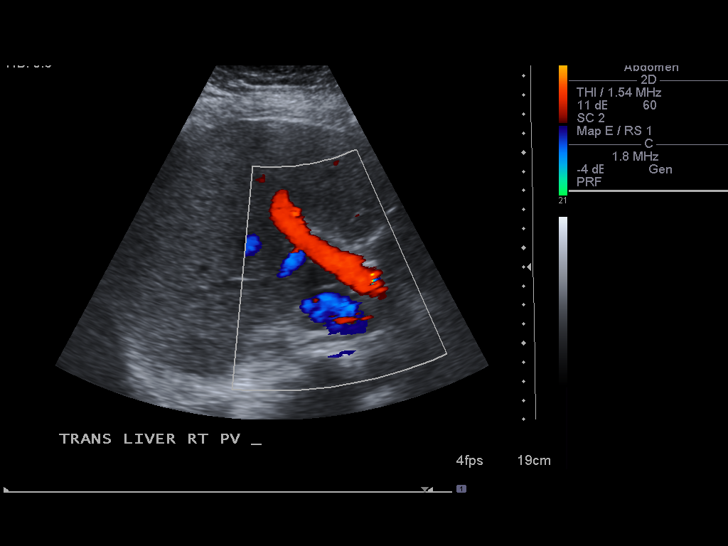
[im 50/99]
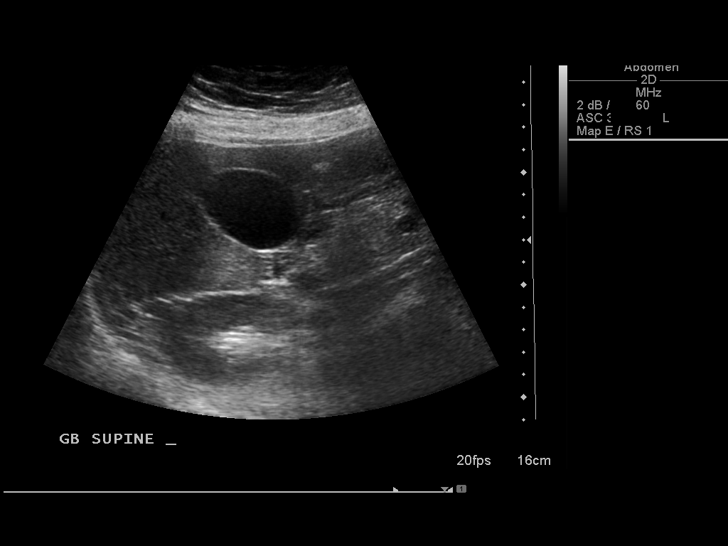
[im 58/99]
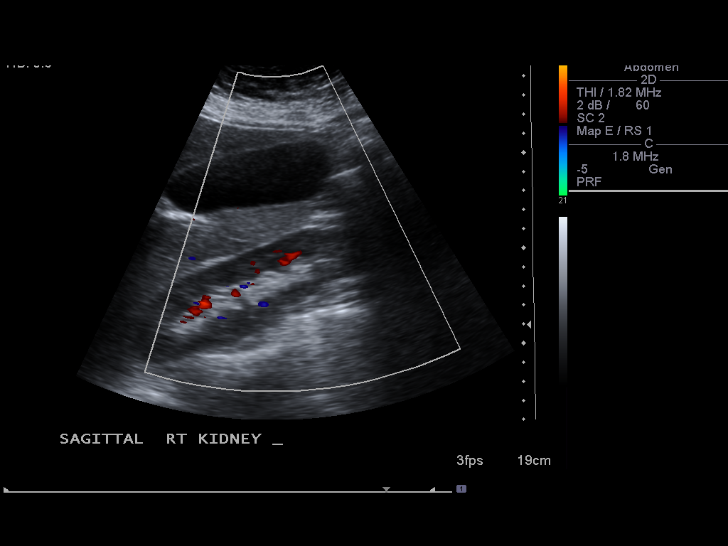
[im 66/99]
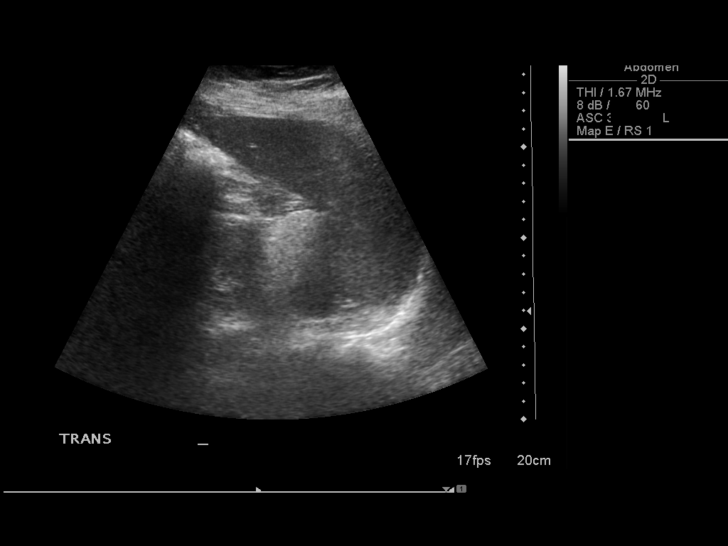
[im 74/99]
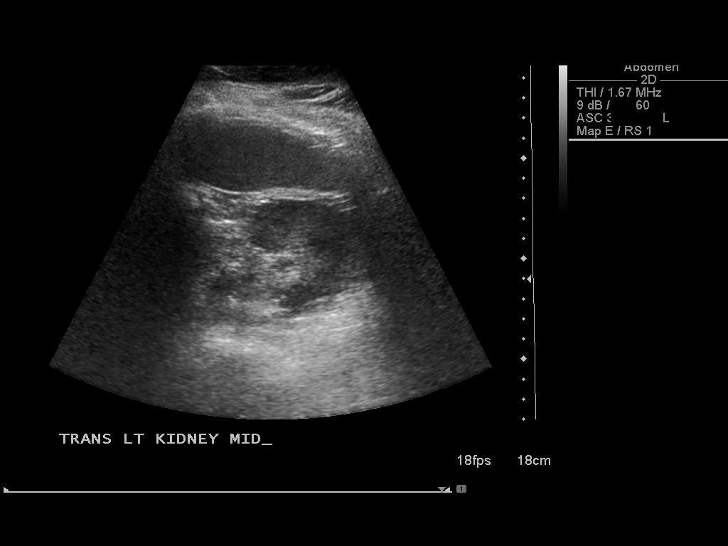
[im 82/99]
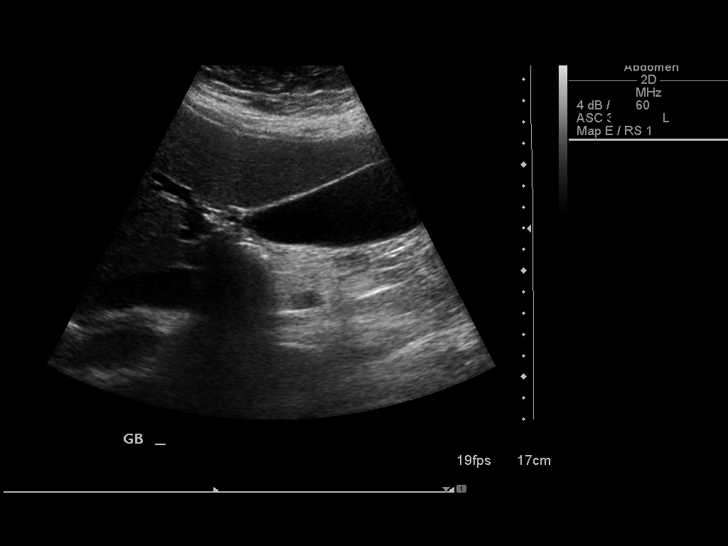
[im 90/99]
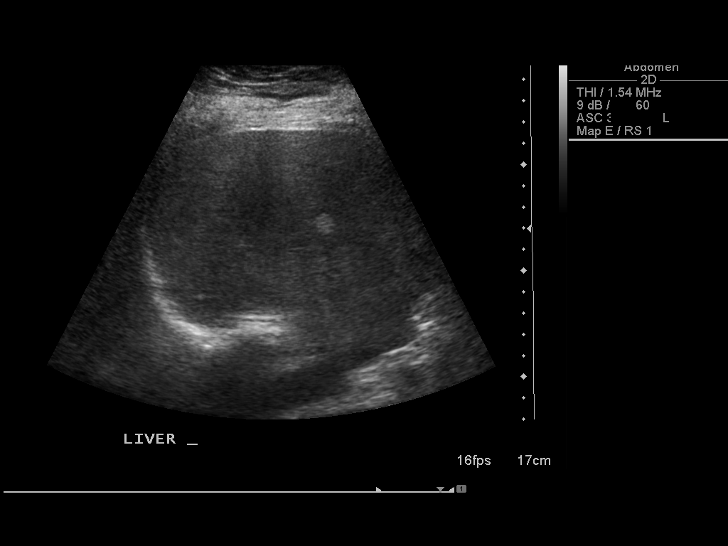
[im 99/99]
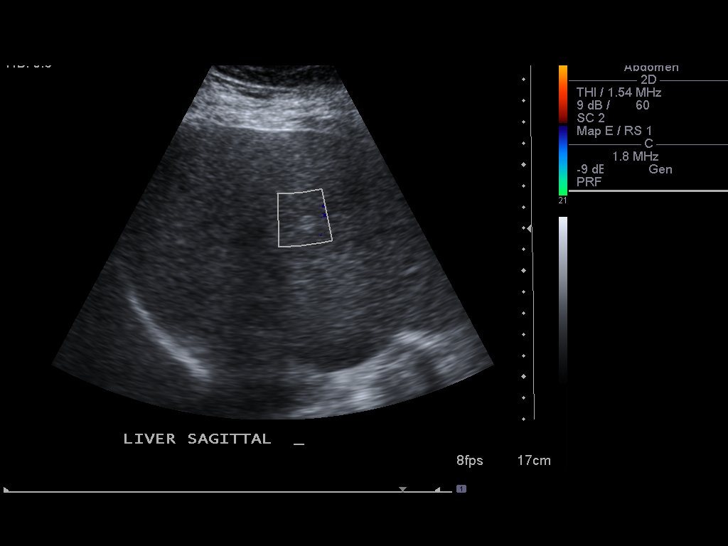

[13 of 25 positions shown; findings below may reference images not displayed]

FINDINGS: Gallbladder:  The gallbladder is visualized and no gallstones are
noted.  There is no pain over the gallbladder with compression

Common bile duct:  The common bile duct is normal measuring 3.3 mm
in diameter.

Liver:  The liver is mildly echogenic which may represent mild
fatty infiltration.  Two small echogenic structures are present in
the right lobe most consistent with hemangiomas with the larger
measuring 2.1 x 1.1 x 1.1 cm and the smaller measuring 0.9 x 0.9 x
1.0 cm.

IVC:  Appears normal.

Pancreas:  The tail of the pancreas is obscured by bowel gas view

Spleen:  It the spleen is normal measuring 11.9 cm sagittally.

Right Kidney:  No hydronephrosis is seen.  The right kidney
measures 13.2 cm sagittally.

Left Kidney:  No hydronephrosis.  The left kidney measures 14.7 cm.

Abdominal aorta:  The abdominal aorta is normal in caliber.

This study is somewhat compromised by large patient body habitus.
IMPRESSION: 1.  No gallstones.
2.  Mild increased echogenicity of the liver may indicate fatty
infiltration.  Two small echogenic foci in the right lobe are
consistent with small hemangiomas.
3.  The pancreas is partially obscured by bowel gas.
4.  Somewhat prominent kidneys.  No hydronephrosis.

## 2012-02-15 IMAGING — CR DG CHEST 2V
2 series · 2 of 2 positions shown · non-contrast
Comparison: None.

CLINICAL DATA: Preoperative evaluation.  Right upper quadrant pain.

CHEST - 2 VIEW

[w chest pa]
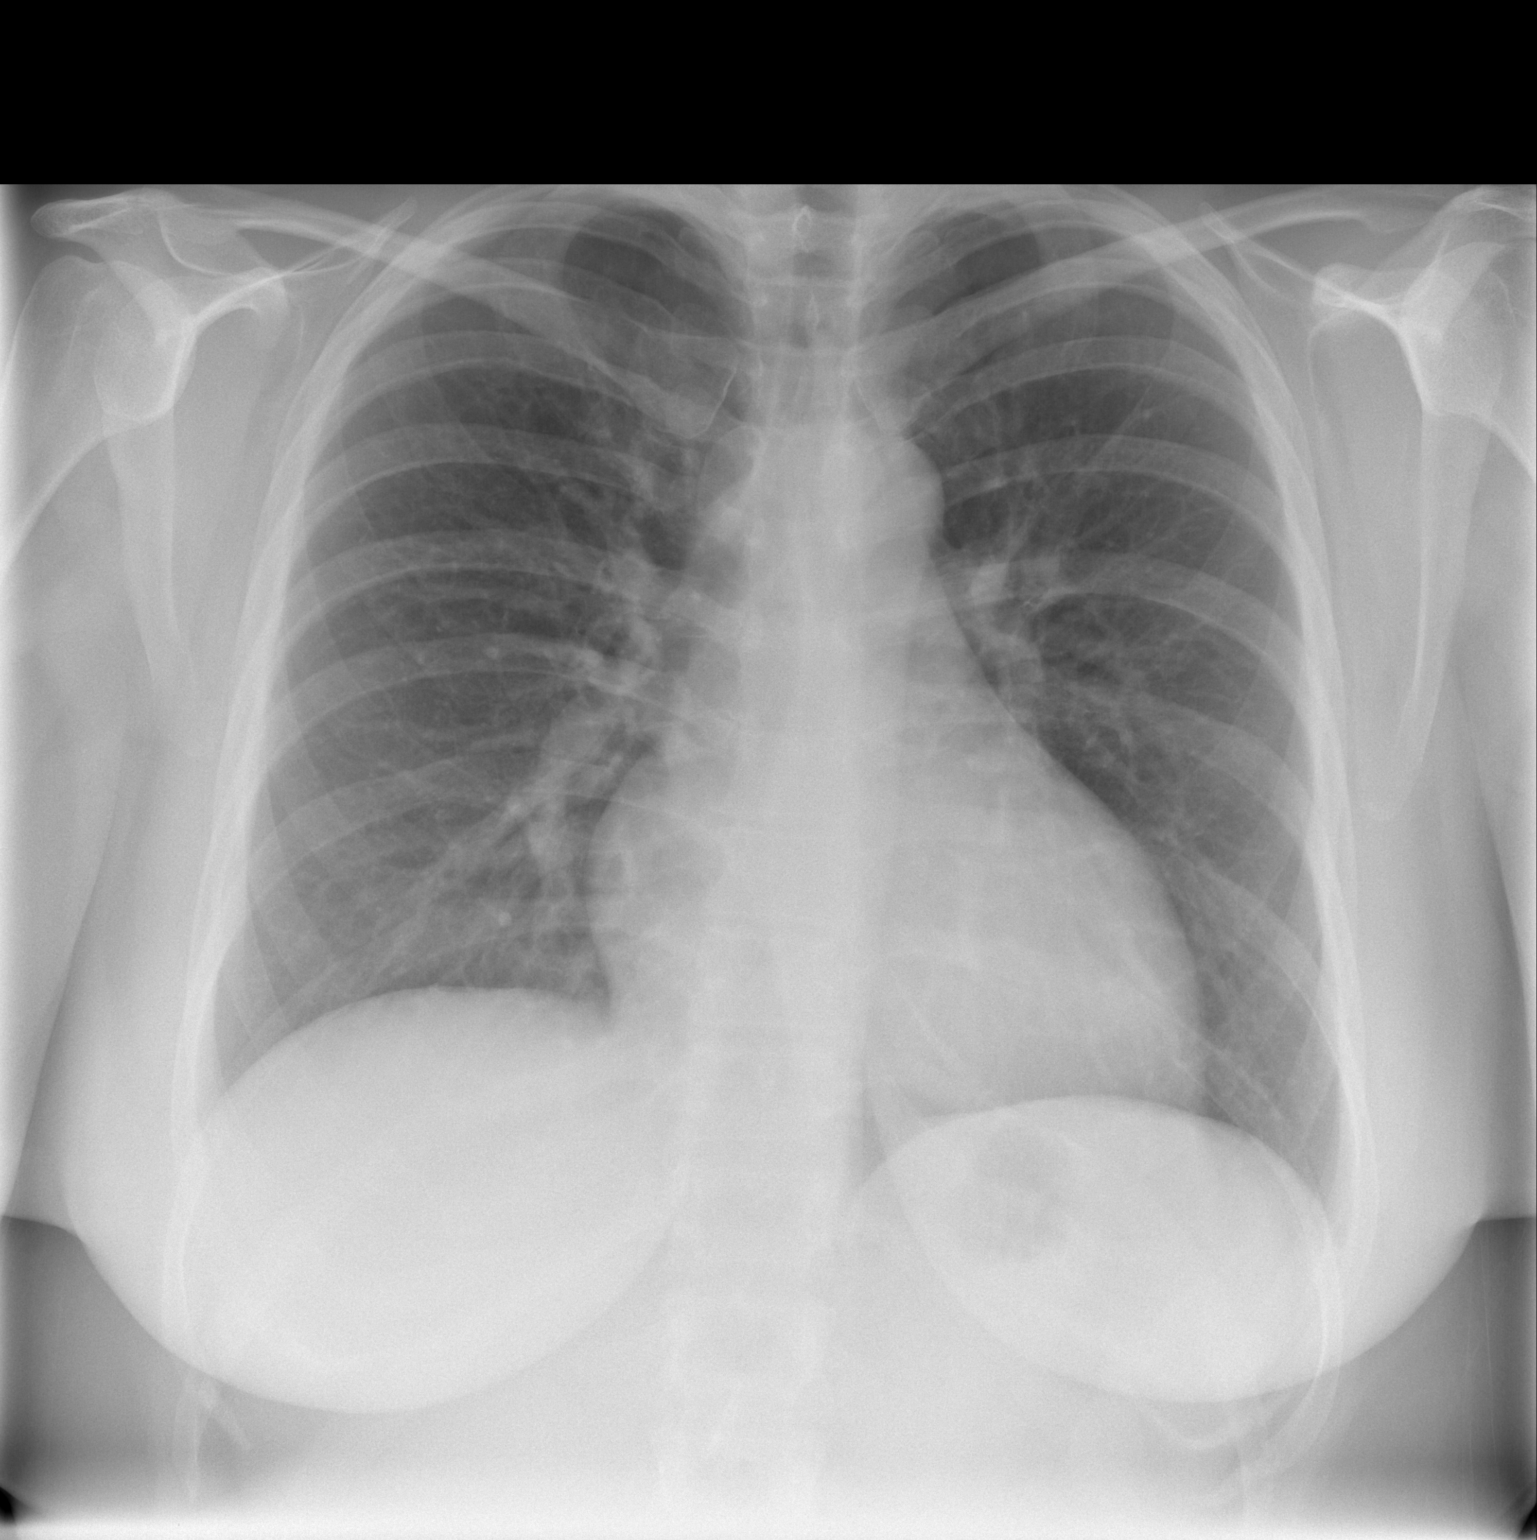

[w chest lat]
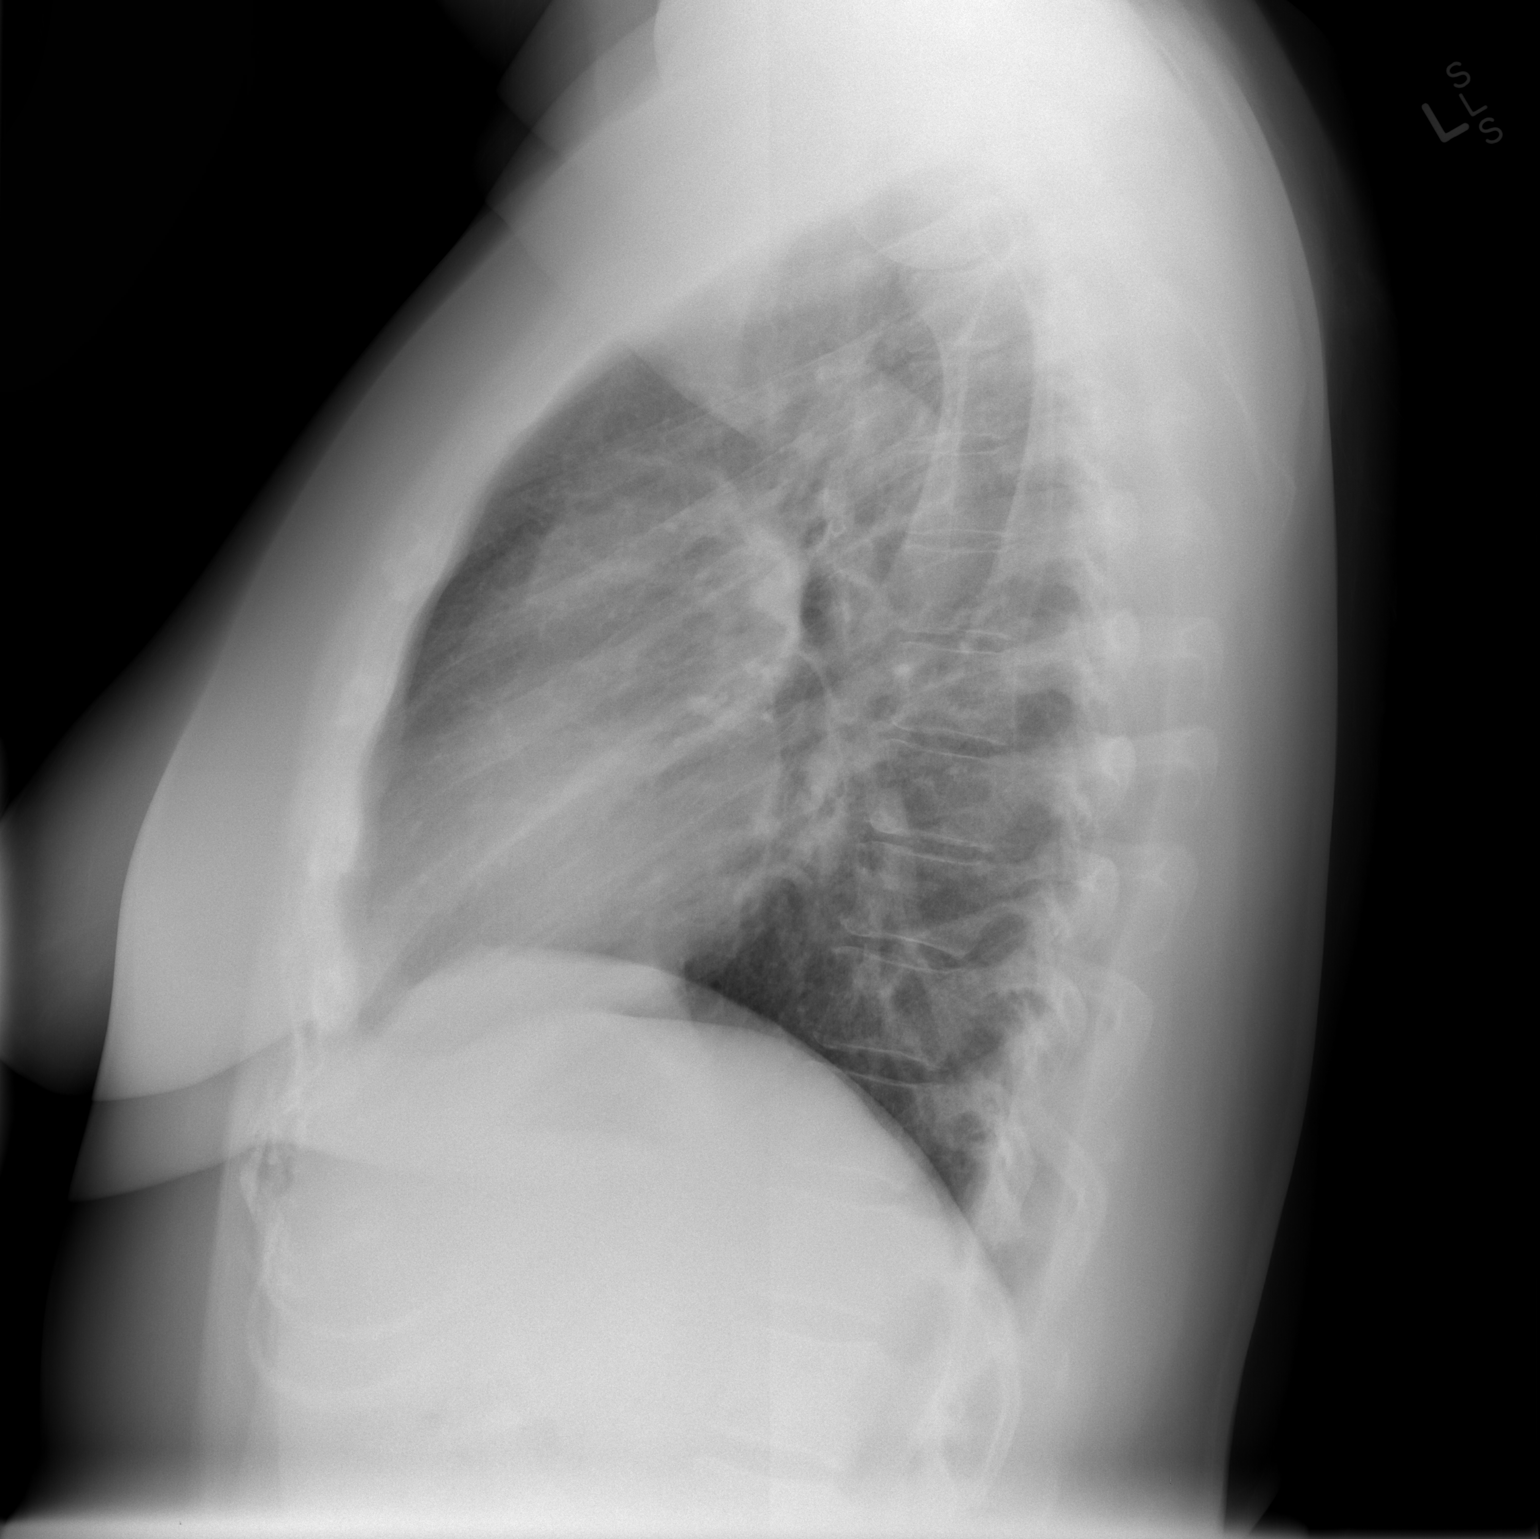

[2 of 2 positions shown; findings below may reference images not displayed]

FINDINGS: The lungs are clear bilaterally.  No confluent airspace
opacities, pleural effuions or pneumothoracies are seen.  The heart
is normal in size and contour.  The upper abdomen and osseous
structures are normal. Specifically, no intra-abdominal free air is
seen.
IMPRESSION: No acute cardiopulmonary disease.

## 2012-02-19 IMAGING — RF DG CHOLANGIOGRAM OPERATIVE
1 series · 4 of 4 positions shown · non-contrast
Comparison: None

CLINICAL DATA: Cholelithiasis

INTRAOPERATIVE CHOLANGIOGRAM
TECHNIQUE: Cholangiographic images from the C-arm fluoroscopic
device were submitted for interpretation post-operatively.  Please
see the procedural report for the amount of contrast and the
fluoroscopy time utilized.

[Series 1: run · 4 of 74 frames shown]
[frame 12/74]
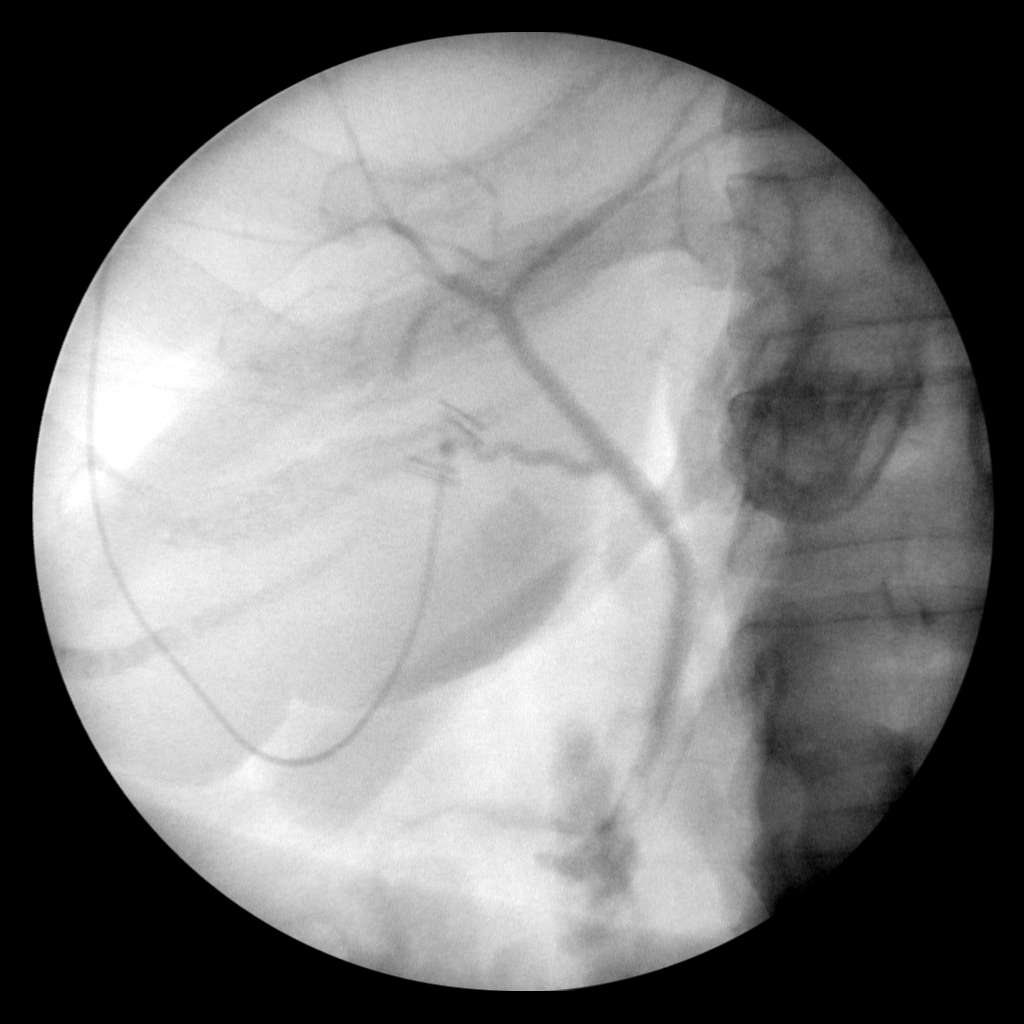
[frame 38/74]
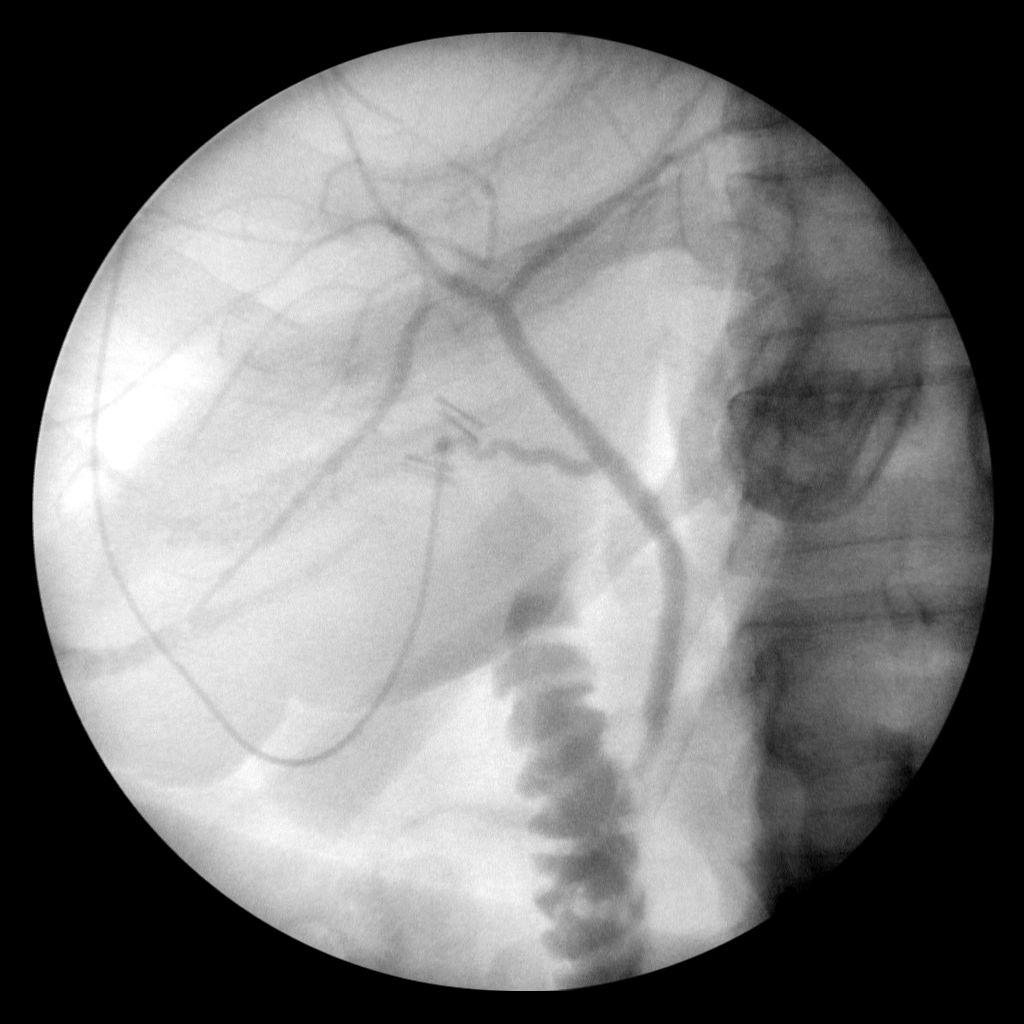
[frame 49/74]
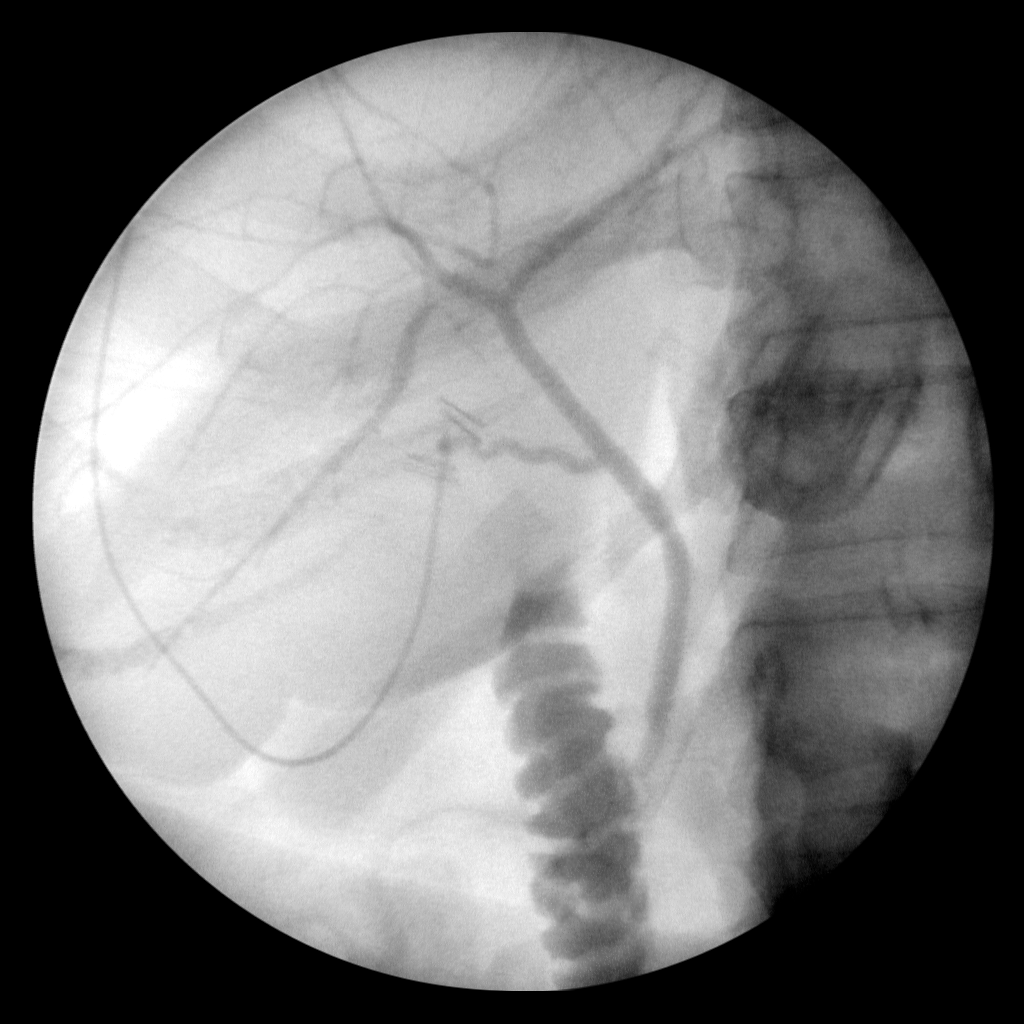
[frame 63/74]
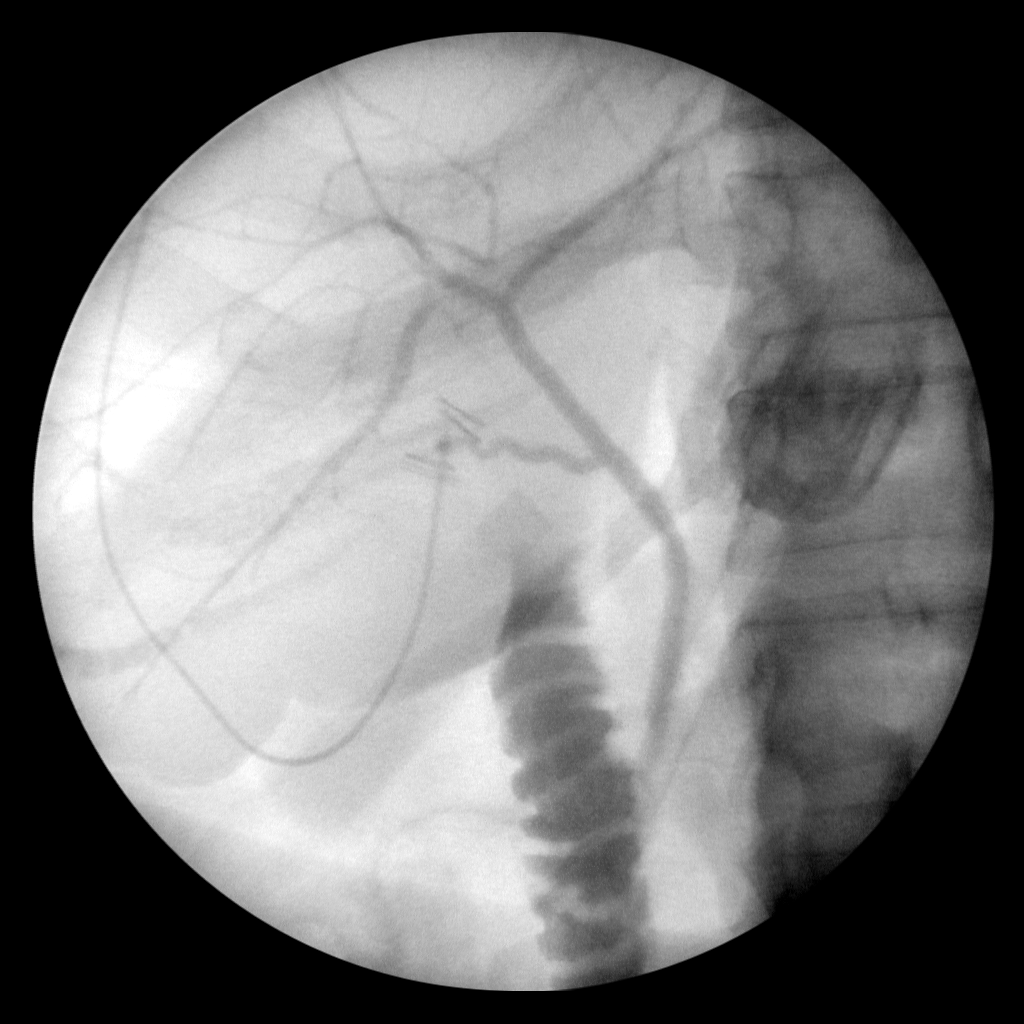

[4 of 4 positions shown; findings below may reference images not displayed]

FINDINGS: No persistent filling defects in the common duct.
Intrahepatic ducts are incompletely visualized, appearing
decompressed centrally. Contrast passes into the duodenum.

IMPRESSION

Negative for retained common duct stone.

## 2013-07-20 ENCOUNTER — Encounter (HOSPITAL_COMMUNITY): Payer: Self-pay | Admitting: Pharmacy Technician

## 2013-07-22 NOTE — Patient Instructions (Addendum)
[name]  07/22/2013   Your procedure is scheduled on:  08/02/2013   Report to [facility] at [place] AM.  Call this number if you have problems the morning of surgery: [phone]   Remember:   Do not eat food or drink liquids after midnight.   Take these medicines the morning of surgery with A SIP OF WATER: allegra, flonase   Do not wear jewelry, make-up or nail polish.  Do not wear lotions, powders, or perfumes.   Do not shave 48 hours prior to surgery.   Do not bring valuables to the hospital.  Contacts, dentures or bridgework may not be worn into surgery.  Leave suitcase in the car. After surgery it may be brought to your room.  For patients admitted to the hospital, checkout time is 11:00 AM the day of discharge.      [facility] - Preparing for Surgery Before surgery, you can play an important role.  Because skin is not sterile, your skin needs to be as free of germs as possible.  You can reduce the number of germs on your skin by washing with CHG (chlorahexidine gluconate) soap before surgery.  CHG is an antiseptic cleaner which kills germs and bonds with the skin to continue killing germs even after washing. Please DO NOT use if you have an allergy to CHG or antibacterial soaps.  If your skin becomes reddened/irritated stop using the CHG and inform your nurse when you arrive at Short Stay. Do not shave (including legs and underarms) for at least 48 hours prior to the first CHG shower.  You may shave your face. Please follow these instructions carefully:  1.  Shower with CHG Soap the night before surgery and the  morning of Surgery.  2.  If you choose to wash your hair, wash your hair first as usual with your  normal  shampoo.  3.  After you shampoo, rinse your hair and body thoroughly to remove the  shampoo.                            4.  Use CHG as you would any other liquid soap.  You can apply chg directly  to the skin and wash                       Gently  with a scrungie or clean washcloth.  5.  Apply the CHG Soap to your body ONLY FROM THE NECK DOWN.   Do not use on open                           Wound or open sores. Avoid contact with eyes, ears mouth and genitals (private parts).                        Genitals (private parts) with your normal soap.             6.  Wash thoroughly, paying special attention to the area where your surgery  will be performed.  7.  Thoroughly rinse your body with warm water from the neck down.  8.  DO NOT shower/wash with your normal soap after using and rinsing off  the CHG Soap.                9.  Pat yourself dry with a clean  towel.            10.  Wear clean pajamas.            11.  Place clean sheets on your bed the night of your first shower and do not  sleep with pets. Day of Surgery : Do not apply any lotions/deodorants the morning of surgery.  Please wear clean clothes to the hospital/surgery center.  FAILURE TO FOLLOW THESE INSTRUCTIONS MAY RESULT IN THE CANCELLATION OF YOUR SURGERY PATIENT SIGNATURE_________________________________  NURSE SIGNATURE__________________________________  ________________________________________________________________________   Adam Phenix  An incentive spirometer is a tool that can help keep your lungs clear and active. This tool measures how well you are filling your lungs with each breath. Taking long deep breaths may help reverse or decrease the chance of developing breathing (pulmonary) problems (especially infection) following:  A long period of time when you are unable to move or be active. BEFORE THE PROCEDURE   If the spirometer includes an indicator to show your best effort, your nurse or respiratory therapist will set it to a desired goal.  If possible, sit up straight or lean slightly forward. Try not to slouch.  Hold the incentive spirometer in an upright position. INSTRUCTIONS FOR USE  1. Sit on the edge of your bed if possible, or sit up  as far as you can in bed or on a chair. 2. Hold the incentive spirometer in an upright position. 3. Breathe out normally. 4. Place the mouthpiece in your mouth and seal your lips tightly around it. 5. Breathe in slowly and as deeply as possible, raising the piston or the ball toward the top of the column. 6. Hold your breath for 3-5 seconds or for as long as possible. Allow the piston or ball to fall to the bottom of the column. 7. Remove the mouthpiece from your mouth and breathe out normally. 8. Rest for a few seconds and repeat Steps 1 through 7 at least 10 times every 1-2 hours when you are awake. Take your time and take a few normal breaths between deep breaths. 9. The spirometer may include an indicator to show your best effort. Use the indicator as a goal to work toward during each repetition. 10. After each set of 10 deep breaths, practice coughing to be sure your lungs are clear. If you have an incision (the cut made at the time of surgery), support your incision when coughing by placing a pillow or rolled up towels firmly against it. Once you are able to get out of bed, walk around indoors and cough well. You may stop using the incentive spirometer when instructed by your caregiver.  RISKS AND COMPLICATIONS  Take your time so you do not get dizzy or light-headed.  If you are in pain, you may need to take or ask for pain medication before doing incentive spirometry. It is harder to take a deep breath if you are having pain. AFTER USE  Rest and breathe slowly and easily.  It can be helpful to keep track of a log of your progress. Your caregiver can provide you with a simple table to help with this. If you are using the spirometer at home, follow these instructions: [place] IF:   You are having difficultly using the spirometer.  You have trouble using the spirometer as often as instructed.  Your pain medication is not giving enough relief while using the  spirometer.  You develop fever of 100.5 F (38.1 C) or  higher. SEEK IMMEDIATE MEDICAL CARE IF:   You cough up bloody sputum that had not been present before.  You develop fever of 102 F (38.9 C) or greater.  You develop worsening pain at or near the incision site. MAKE SURE YOU:   Understand these instructions.  Will watch your condition.  Will get help right away if you are not doing well or get worse. Document Released: 07/15/2006 Document Revised: 05/27/2011 Document Reviewed: 09/15/2006 ExitCare Patient Information 2014 ExitCare, [place].   ________________________________________________________________________  WHAT IS A BLOOD TRANSFUSION? Blood Transfusion Information  A transfusion is the replacement of blood or some of its parts. Blood is made up of multiple cells which provide different functions.  Red blood cells carry oxygen and are used for blood loss replacement.  White blood cells fight against infection.  Platelets control bleeding.  Plasma helps clot blood.  Other blood products are available for specialized needs, such as hemophilia or other clotting disorders. BEFORE THE TRANSFUSION  Who gives blood for transfusions?   Healthy volunteers who are fully evaluated to make sure their blood is safe. This is blood bank blood. Transfusion therapy is the safest it has ever been in the practice of medicine. Before blood is taken from a donor, a complete history is taken to make sure that person has no history of diseases nor engages in risky social behavior (examples are intravenous drug use or sexual activity with multiple partners). The donor's travel history is screened to minimize risk of transmitting infections, such as malaria. The donated blood is tested for signs of infectious diseases, such as HIV and hepatitis. The blood is then tested to be sure it is compatible with you in order to minimize the chance of a transfusion reaction. If you or a relative  donates blood, this is often done in anticipation of surgery and is not appropriate for emergency situations. It takes many days to process the donated blood. RISKS AND COMPLICATIONS Although transfusion therapy is very safe and saves many lives, the main dangers of transfusion include:   Getting an infectious disease.  Developing a transfusion reaction. This is an allergic reaction to something in the blood you were given. Every precaution is taken to prevent this. The decision to have a blood transfusion has been considered carefully by your caregiver before blood is given. Blood is not given unless the benefits outweigh the risks. AFTER THE TRANSFUSION  Right after receiving a blood transfusion, you will usually feel much better and more energetic. This is especially true if your red blood cells have gotten low (anemic). The transfusion raises the level of the red blood cells which carry oxygen, and this usually causes an energy increase.  The nurse administering the transfusion will monitor you carefully for complications. HOME CARE INSTRUCTIONS  No special instructions are needed after a transfusion. You may find your energy is better. Speak with your caregiver about any limitations on activity for underlying diseases you may have. SEEK MEDICAL CARE IF:   Your condition is not improving after your transfusion.  You develop redness or irritation at the intravenous (IV) site. SEEK IMMEDIATE MEDICAL CARE IF:  Any of the following symptoms occur over the next 12 hours:  Shaking chills.  You have a temperature by mouth above 102 F (38.9 C), not controlled by medicine.  Chest, back, or muscle pain.  People around you feel you are not acting correctly or are confused.  Shortness of breath or difficulty breathing.  Dizziness and fainting.  You get a rash or develop hives.  You have a decrease in urine output.  Your urine turns a dark color or changes to pink, red, or brown. Any  of the following symptoms occur over the next 10 days:  You have a temperature by mouth above 102 F (38.9 C), not controlled by medicine.  Shortness of breath.  Weakness after normal activity.  The white part of the eye turns yellow (jaundice).  You have a decrease in the amount of urine or are urinating less often.  Your urine turns a dark color or changes to pink, red, or brown. Document Released: 03/01/2000 Document Revised: 05/27/2011 Document Reviewed: 10/19/2007 ExitCare Patient Information 2014 ExitCare, [place].  _______________________________________________________________________    Please read over the following fact sheets that you were given: MRSA Information

## 2013-07-26 ENCOUNTER — Ambulatory Visit (HOSPITAL_COMMUNITY)
Admission: RE | Admit: 2013-07-26 | Discharge: 2013-07-26 | Disposition: A | Discharge status: Home / Self Care | Payer: BC Managed Care – PPO | Source: Ambulatory Visit | Attending: Anesthesiology | Admitting: Anesthesiology

## 2013-07-26 ENCOUNTER — Encounter (HOSPITAL_COMMUNITY): Payer: Self-pay

## 2013-07-26 ENCOUNTER — Encounter (INDEPENDENT_AMBULATORY_CARE_PROVIDER_SITE_OTHER): Payer: Self-pay

## 2013-07-26 ENCOUNTER — Encounter (HOSPITAL_COMMUNITY)
Admission: RE | Admit: 2013-07-26 | Discharge: 2013-07-26 | Disposition: A | Discharge status: Home / Self Care | Payer: BC Managed Care – PPO | Source: Ambulatory Visit | Attending: Orthopedic Surgery | Admitting: Orthopedic Surgery

## 2013-07-26 DIAGNOSIS — Z01812 Encounter for preprocedural laboratory examination: Secondary | ICD-10-CM | POA: Insufficient documentation

## 2013-07-26 DIAGNOSIS — Z01810 Encounter for preprocedural cardiovascular examination: Secondary | ICD-10-CM | POA: Insufficient documentation

## 2013-07-26 DIAGNOSIS — I77819 Aortic ectasia, unspecified site: Secondary | ICD-10-CM | POA: Insufficient documentation

## 2013-07-26 DIAGNOSIS — Z01818 Encounter for other preprocedural examination: Secondary | ICD-10-CM | POA: Insufficient documentation

## 2013-07-26 HISTORY — DX: Neoplasm of unspecified behavior of endocrine glands and other parts of nervous system: D49.7

## 2013-07-26 HISTORY — DX: Cardiac murmur, unspecified: R01.1

## 2013-07-26 HISTORY — DX: Pneumonia, unspecified organism: J18.9

## 2013-07-26 HISTORY — DX: Nontoxic goiter, unspecified: E04.9

## 2013-07-26 LAB — PROTIME-INR
INR: 0.95 (ref 0.00–1.49)
Prothrombin Time: 12.5 seconds (ref 11.6–15.2)

## 2013-07-26 LAB — APTT: aPTT: 40 seconds — ABNORMAL HIGH (ref 24–37)

## 2013-07-26 LAB — BASIC METABOLIC PANEL
BUN: 11 mg/dL (ref 6–23)
CALCIUM: 8.7 mg/dL (ref 8.4–10.5)
CHLORIDE: 104 meq/L (ref 96–112)
CO2: 26 mEq/L (ref 19–32)
CREATININE: 0.55 mg/dL (ref 0.50–1.10)
GFR calc Af Amer: >90 mL/min (ref >90)
GFR calc non Af Amer: >90 mL/min (ref >90)
GLUCOSE: 110 mg/dL — AB (ref 70–99)
Potassium: 3.8 mEq/L (ref 3.7–5.3)
Sodium: 140 mEq/L (ref 137–147)

## 2013-07-26 LAB — URINALYSIS, ROUTINE W REFLEX MICROSCOPIC
Bilirubin Urine: NEGATIVE
GLUCOSE, UA: NEGATIVE mg/dL
Hgb urine dipstick: NEGATIVE
Ketones, ur: NEGATIVE mg/dL
LEUKOCYTES UA: NEGATIVE
Nitrite: NEGATIVE
PROTEIN: NEGATIVE mg/dL
Specific Gravity, Urine: 1.005 (ref 1.005–1.030)
Urobilinogen, UA: 0.2 mg/dL (ref 0.0–1.0)
pH: 6.5 (ref 5.0–8.0)

## 2013-07-26 LAB — SURGICAL PCR SCREEN
MRSA, PCR: NEGATIVE
Staphylococcus aureus: NEGATIVE

## 2013-07-26 LAB — CBC
HCT: 40.1 % (ref 36.0–46.0)
Hemoglobin: 13.7 g/dL (ref 12.0–15.0)
MCH: 30.9 pg (ref 26.0–34.0)
MCHC: 34.2 g/dL (ref 30.0–36.0)
MCV: 90.3 fL (ref 78.0–100.0)
PLATELETS: 284 10*3/uL (ref 150–400)
RBC: 4.44 MIL/uL (ref 3.87–5.11)
RDW: 13.7 % (ref 11.5–15.5)
WBC: 5.9 10*3/uL (ref 4.0–10.5)

## 2013-07-26 NOTE — Progress Notes (Addendum)
EKG 2013 on chart, LOV note 06/29/13 Dr. Mauricio Po on chart, chest x-ray 06/2012 on chart, progress note [place] 07/12/12 on chart, surgery clearance note Roe Coombs PA-C 07/26/13 on chart

## 2013-08-01 NOTE — H&P (Signed)
TOTAL KNEE ADMISSION H&P  Patient is being admitted for bilateral total knee arthroplasty.  Subjective:  Chief Complaint:     Bilateral OA / knee pain.  HPI: [name], 43 y.o. female, has a history of pain and functional disability in the bilateral knee due to arthritis and has failed non-surgical conservative treatments for greater than 12 weeks to include NSAID's and/or analgesics, corticosteriod injections, viscosupplementation injections and activity modification.  Onset of symptoms was gradual, starting 2-3 years ago with gradually worsening course since that time. The patient noted no past surgery on bilateral knee(s).  Patient currently rates pain in the bilateral knee(s) at 9 out of 10 with activity. Patient has night pain, worsening of pain with activity and weight bearing, pain that interferes with activities of daily living, pain with passive range of motion, crepitus and joint swelling.  Patient has evidence of periarticular osteophytes and joint space narrowing by imaging studies. There is no active infection.  Risks, benefits and expectations were discussed with the patient.  Risks including but not limited to the risk of anesthesia, blood clots, nerve damage, blood vessel damage, failure of the prosthesis, infection and up to and including death.  Patient understand the risks, benefits and expectations and wishes to proceed with surgery.   D/C Plans:   Home with HHPT  Post-op Meds:    No Rx given  Tranexamic Acid:   To be given  Decadron:    To be given  FYI:   Xarelto post-op  Oxycodone post-op  CPAP   Patient Active Problem List   Diagnosis Date Noted  . Morbid obesity 10/12/2010  . Hypertension 10/12/2010  . RUQ PAIN 05/04/2010  . EPIGASTRIC PAIN 05/04/2010   Past Medical History  Diagnosis Date  . Arthritis   . Sleep apnea   . Obesity, Class III, BMI 40-49.9 (morbid obesity)   . Heart murmur   . Pneumonia     hx of  . Enlarged thyroid     "all tests are  normal"  . GERD (gastroesophageal reflux disease)     with motrin  . Adrenal tumor     "monitoring"  . Anemia     hx of    Past Surgical History  Procedure Laterality Date  . Abdominal hysterectomy  03/2003    partial  . Tubal ligation    . Lipoma excision  01/11/2010  . Cholecystectomy  2012    No prescriptions prior to admission   Allergies  Allergen Reactions  . Sulfonamide Derivatives Anaphylaxis  . Levaquin [Levofloxacin]     Joint pain  . Penicillins Rash    History  Substance Use Topics  . Smoking status: Never Smoker   . Smokeless tobacco: Never Used  . Alcohol Use: No    Family History  Problem Relation Age of Onset  . Breast cancer    . Pancreatic cancer      uncle  . Diabetes Mother   . Cancer Mother     breast  . Irritable bowel syndrome Sister      Review of Systems  Constitutional: Negative.   HENT: Negative.   Eyes: Negative.   Respiratory: Negative.   Cardiovascular: Negative.   Gastrointestinal: Negative.   Genitourinary: Positive for frequency.  Musculoskeletal: Positive for joint pain.  Skin: Negative.   Neurological: Negative.   Endo/Heme/Allergies: Negative.   Psychiatric/Behavioral: Negative.     Objective:  Physical Exam  Constitutional: She is oriented to person, place, and time. She appears well-developed and well-nourished.  HENT:  Head: Normocephalic and atraumatic.  Mouth/Throat: Oropharynx is clear and moist.  Eyes: Pupils are equal, round, and reactive to light.  Neck: Neck supple. No JVD present. No tracheal deviation present. No thyromegaly present.  Cardiovascular: Normal rate, regular rhythm and intact distal pulses.   Murmur heard. Respiratory: Effort normal and breath sounds normal. No respiratory distress. She has no wheezes.  GI: Soft. There is no tenderness. There is no guarding.  Musculoskeletal:       Right knee: She exhibits decreased range of motion, swelling and bony tenderness. She exhibits no  ecchymosis, no deformity, no laceration and no erythema. Tenderness found.       Left knee: She exhibits decreased range of motion, swelling and bony tenderness. She exhibits no ecchymosis, no deformity, no laceration and no erythema. Tenderness found.  Lymphadenopathy:    She has no cervical adenopathy.  Neurological: She is alert and oriented to person, place, and time.  Skin: Skin is warm and dry.  Psychiatric: She has a normal mood and affect.      Labs:  Estimated body mass index is 49.65 kg/(m^2) as calculated from the following:   Height as of 10/17/10: 5\' 11"  (1.803 m).   Weight as of 10/17/10: 161.39 kg (355 lb 12.8 oz).   Imaging Review Plain radiographs demonstrate severe degenerative joint disease of the bilaterally knee(s). The overall alignment isneutral. The bone quality appears to be good for age and reported activity level.  Assessment/Plan:  End stage arthritis, bilaterally knee   The patient history, physical examination, clinical judgment of the provider and imaging studies are consistent with end stage degenerative joint disease of the bilaterally knee(s) and total knee arthroplasty is deemed medically necessary. The treatment options including medical management, injection therapy arthroscopy and arthroplasty were discussed at length. The risks and benefits of total knee arthroplasty were presented and reviewed. The risks due to aseptic loosening, infection, stiffness, patella tracking problems, thromboembolic complications and other imponderables were discussed. The patient acknowledged the explanation, agreed to proceed with the plan and consent was signed. Patient is being admitted for inpatient treatment for surgery, pain control, PT, OT, prophylactic antibiotics, VTE prophylaxis, progressive ambulation and ADL's and discharge planning. The patient is planning to be discharged home with home health services / SNF.     [place] P[place] Mickala Laton   PAC  08/01/2013, 4:42 PM

## 2013-08-02 ENCOUNTER — Encounter (HOSPITAL_COMMUNITY)
Admission: RE | Disposition: A | Discharge status: Rehabilitation Facility | Payer: Self-pay | Source: Ambulatory Visit | Attending: Orthopedic Surgery

## 2013-08-02 ENCOUNTER — Inpatient Hospital Stay (HOSPITAL_COMMUNITY): Payer: BC Managed Care – PPO | Admitting: Anesthesiology

## 2013-08-02 ENCOUNTER — Encounter (HOSPITAL_COMMUNITY): Payer: BC Managed Care – PPO | Admitting: Anesthesiology

## 2013-08-02 ENCOUNTER — Encounter (HOSPITAL_COMMUNITY): Payer: Self-pay | Admitting: *Deleted

## 2013-08-02 ENCOUNTER — Inpatient Hospital Stay (HOSPITAL_COMMUNITY)
Admission: RE | Admit: 2013-08-02 | Discharge: 2013-08-05 | DRG: 462 | Disposition: A | Discharge status: Rehabilitation Facility | Payer: BC Managed Care – PPO | Source: Ambulatory Visit | Attending: Orthopedic Surgery | Admitting: Orthopedic Surgery

## 2013-08-02 DIAGNOSIS — D62 Acute posthemorrhagic anemia: Secondary | ICD-10-CM | POA: Diagnosis not present

## 2013-08-02 DIAGNOSIS — K219 Gastro-esophageal reflux disease without esophagitis: Secondary | ICD-10-CM | POA: Diagnosis present

## 2013-08-02 DIAGNOSIS — Z79899 Other long term (current) drug therapy: Secondary | ICD-10-CM

## 2013-08-02 DIAGNOSIS — Z96659 Presence of unspecified artificial knee joint: Secondary | ICD-10-CM

## 2013-08-02 DIAGNOSIS — M1710 Unilateral primary osteoarthritis, unspecified knee: Principal | ICD-10-CM | POA: Diagnosis present

## 2013-08-02 DIAGNOSIS — Z6841 Body Mass Index (BMI) 40.0 and over, adult: Secondary | ICD-10-CM

## 2013-08-02 DIAGNOSIS — G4730 Sleep apnea, unspecified: Secondary | ICD-10-CM | POA: Diagnosis present

## 2013-08-02 DIAGNOSIS — D500 Iron deficiency anemia secondary to blood loss (chronic): Secondary | ICD-10-CM | POA: Diagnosis not present

## 2013-08-02 HISTORY — PX: TOTAL KNEE ARTHROPLASTY: SHX125

## 2013-08-02 LAB — TYPE AND SCREEN
ABO/RH(D): A POS
Antibody Screen: NEGATIVE

## 2013-08-02 LAB — ABO/RH: ABO/RH(D): A POS

## 2013-08-02 SURGERY — ARTHROPLASTY, KNEE, BILATERAL, TOTAL
Anesthesia: Epidural | Site: Knee | Laterality: Bilateral

## 2013-08-02 MED ORDER — FENTANYL CITRATE 0.05 MG/ML IJ SOLN
INTRAMUSCULAR | Status: AC
  Filled 2013-08-02: qty 2

## 2013-08-02 MED ORDER — EPHEDRINE SULFATE 50 MG/ML IJ SOLN
INTRAMUSCULAR | Status: DC | PRN
  Administered 2013-08-02: 10 mg via INTRAVENOUS

## 2013-08-02 MED ORDER — METOCLOPRAMIDE HCL 5 MG/ML IJ SOLN
5.0000 mg | Freq: Three times a day (TID) | INTRAMUSCULAR | Status: DC | PRN
  Administered 2013-08-03: 10 mg via INTRAVENOUS
  Filled 2013-08-02: qty 2

## 2013-08-02 MED ORDER — GLYCOPYRROLATE 0.2 MG/ML IJ SOLN
INTRAMUSCULAR | Status: DC | PRN
  Administered 2013-08-02 (×2): 0.2 mg via INTRAVENOUS

## 2013-08-02 MED ORDER — BUPIVACAINE HCL (PF) 0.5 % IJ SOLN
INTRAMUSCULAR | Status: AC
  Filled 2013-08-02: qty 30

## 2013-08-02 MED ORDER — DEXAMETHASONE SODIUM PHOSPHATE 10 MG/ML IJ SOLN
10.0000 mg | Freq: Once | INTRAMUSCULAR | Status: AC
  Administered 2013-08-02: 10 mg via INTRAVENOUS

## 2013-08-02 MED ORDER — BUPIVACAINE-EPINEPHRINE (PF) 0.25% -1:200000 IJ SOLN
INTRAMUSCULAR | Status: AC
  Filled 2013-08-02: qty 60

## 2013-08-02 MED ORDER — FENTANYL CITRATE 0.05 MG/ML IJ SOLN
INTRAMUSCULAR | Status: DC | PRN
  Administered 2013-08-02: 25 ug via INTRAVENOUS
  Administered 2013-08-02: 50 ug via INTRAVENOUS
  Administered 2013-08-02: 25 ug via INTRAVENOUS
  Administered 2013-08-02 (×2): 50 ug via INTRAVENOUS
  Administered 2013-08-02: 25 ug via INTRAVENOUS
  Administered 2013-08-02: 50 ug via INTRAVENOUS
  Administered 2013-08-02: 25 ug via INTRAVENOUS

## 2013-08-02 MED ORDER — HYDROMORPHONE HCL PF 1 MG/ML IJ SOLN
0.5000 mg | INTRAMUSCULAR | Status: DC | PRN
  Administered 2013-08-02: 1 mg via INTRAVENOUS
  Administered 2013-08-02: 0.5 mg via INTRAVENOUS
  Administered 2013-08-03: 1 mg via INTRAVENOUS
  Administered 2013-08-03 – 2013-08-04 (×7): 2 mg via INTRAVENOUS
  Administered 2013-08-04 – 2013-08-05 (×2): 1 mg via INTRAVENOUS
  Filled 2013-08-02: qty 2
  Filled 2013-08-02: qty 1
  Filled 2013-08-02 (×2): qty 2
  Filled 2013-08-02: qty 1
  Filled 2013-08-02 (×3): qty 2
  Filled 2013-08-02: qty 1
  Filled 2013-08-02 (×2): qty 2
  Filled 2013-08-02 (×2): qty 1

## 2013-08-02 MED ORDER — CLINDAMYCIN PHOSPHATE 600 MG/50ML IV SOLN
600.0000 mg | Freq: Four times a day (QID) | INTRAVENOUS | Status: AC
  Administered 2013-08-02 (×2): 600 mg via INTRAVENOUS
  Filled 2013-08-02 (×3): qty 50

## 2013-08-02 MED ORDER — HYDROMORPHONE HCL PF 1 MG/ML IJ SOLN: 0.2500 mg | INTRAMUSCULAR | Status: DC | PRN

## 2013-08-02 MED ORDER — MEPERIDINE HCL 50 MG/ML IJ SOLN: 6.2500 mg | INTRAMUSCULAR | Status: DC | PRN

## 2013-08-02 MED ORDER — SODIUM CHLORIDE 0.9 % IV SOLN
INTRAVENOUS | Status: DC
  Administered 2013-08-02: 14:00:00 via EPIDURAL
  Filled 2013-08-02 (×5): qty 20

## 2013-08-02 MED ORDER — PROPOFOL 10 MG/ML IV BOLUS
INTRAVENOUS | Status: DC | PRN
  Administered 2013-08-02 (×5): 20 mg via INTRAVENOUS

## 2013-08-02 MED ORDER — PROPOFOL 10 MG/ML IV BOLUS
INTRAVENOUS | Status: AC
  Filled 2013-08-02: qty 20

## 2013-08-02 MED ORDER — GLYCOPYRROLATE 0.2 MG/ML IJ SOLN
INTRAMUSCULAR | Status: AC
  Filled 2013-08-02: qty 1

## 2013-08-02 MED ORDER — SODIUM CHLORIDE 0.9 % IV SOLN
INTRAVENOUS | Status: DC
  Administered 2013-08-02 – 2013-08-03 (×4): via INTRAVENOUS
  Filled 2013-08-02 (×15): qty 1000

## 2013-08-02 MED ORDER — FERROUS SULFATE 325 (65 FE) MG PO TABS
325.0000 mg | ORAL_TABLET | Freq: Three times a day (TID) | ORAL | Status: DC
  Administered 2013-08-02 – 2013-08-05 (×6): 325 mg via ORAL
  Filled 2013-08-02 (×11): qty 1

## 2013-08-02 MED ORDER — ONDANSETRON HCL 4 MG PO TABS: 4.0000 mg | ORAL_TABLET | Freq: Four times a day (QID) | ORAL | Status: DC | PRN

## 2013-08-02 MED ORDER — KETOROLAC TROMETHAMINE 30 MG/ML IJ SOLN
INTRAMUSCULAR | Status: AC
  Filled 2013-08-02: qty 2

## 2013-08-02 MED ORDER — SODIUM CHLORIDE 0.9 % IJ SOLN
INTRAMUSCULAR | Status: AC
  Filled 2013-08-02: qty 10

## 2013-08-02 MED ORDER — OXYCODONE HCL 5 MG PO TABS
5.0000 mg | ORAL_TABLET | ORAL | Status: DC
  Administered 2013-08-02 (×3): 5 mg via ORAL
  Administered 2013-08-03: 10 mg via ORAL
  Administered 2013-08-03: 15 mg via ORAL
  Filled 2013-08-02: qty 3
  Filled 2013-08-02: qty 2
  Filled 2013-08-02 (×3): qty 1

## 2013-08-02 MED ORDER — ONDANSETRON HCL 4 MG/2ML IJ SOLN
INTRAMUSCULAR | Status: AC
  Filled 2013-08-02: qty 2

## 2013-08-02 MED ORDER — LORATADINE 10 MG PO TABS
10.0000 mg | ORAL_TABLET | Freq: Every day | ORAL | Status: DC
  Filled 2013-08-02 (×4): qty 1

## 2013-08-02 MED ORDER — DEXTROSE 5 % IV SOLN
500.0000 mg | Freq: Four times a day (QID) | INTRAVENOUS | Status: DC | PRN
  Filled 2013-08-02: qty 5

## 2013-08-02 MED ORDER — ALUM & MAG HYDROXIDE-SIMETH 200-200-20 MG/5ML PO SUSP: 30.0000 mL | ORAL | Status: DC | PRN

## 2013-08-02 MED ORDER — MENTHOL 3 MG MT LOZG
1.0000 | LOZENGE | OROMUCOSAL | Status: DC | PRN
  Filled 2013-08-02: qty 9

## 2013-08-02 MED ORDER — TRANEXAMIC ACID 100 MG/ML IV SOLN
1000.0000 mg | Freq: Once | INTRAVENOUS | Status: AC
  Administered 2013-08-02: 1000 mg via INTRAVENOUS
  Filled 2013-08-02: qty 10

## 2013-08-02 MED ORDER — ONDANSETRON HCL 4 MG/2ML IJ SOLN
4.0000 mg | Freq: Once | INTRAMUSCULAR | Status: AC
  Administered 2013-08-02 (×2): 4 mg via INTRAVENOUS

## 2013-08-02 MED ORDER — BISACODYL 10 MG RE SUPP: 10.0000 mg | Freq: Every day | RECTAL | Status: DC | PRN

## 2013-08-02 MED ORDER — CLINDAMYCIN PHOSPHATE 900 MG/50ML IV SOLN
900.0000 mg | INTRAVENOUS | Status: AC
  Administered 2013-08-02: 900 mg via INTRAVENOUS

## 2013-08-02 MED ORDER — FLUTICASONE PROPIONATE 50 MCG/ACT NA SUSP
1.0000 | Freq: Every day | NASAL | Status: DC
  Filled 2013-08-02: qty 16

## 2013-08-02 MED ORDER — PROMETHAZINE HCL 25 MG/ML IJ SOLN: 6.2500 mg | INTRAMUSCULAR | Status: DC | PRN

## 2013-08-02 MED ORDER — KETAMINE HCL 50 MG/ML IJ SOLN
INTRAMUSCULAR | Status: DC | PRN
  Administered 2013-08-02 (×2): 20 mg via INTRAMUSCULAR

## 2013-08-02 MED ORDER — SODIUM BICARBONATE 8.4 % IV SOLN
INTRAVENOUS | Status: DC | PRN
  Administered 2013-08-02: 10 mL via EPIDURAL
  Administered 2013-08-02: 5 mL via EPIDURAL

## 2013-08-02 MED ORDER — MIDAZOLAM HCL 2 MG/2ML IJ SOLN
INTRAMUSCULAR | Status: DC | PRN
  Administered 2013-08-02: 2 mg via INTRAVENOUS

## 2013-08-02 MED ORDER — BUPIVACAINE HCL (PF) 0.5 % IJ SOLN
INTRAMUSCULAR | Status: DC | PRN
  Administered 2013-08-02 (×7): 5 mL

## 2013-08-02 MED ORDER — PANTOPRAZOLE SODIUM 20 MG PO TBEC
20.0000 mg | DELAYED_RELEASE_TABLET | Freq: Every day | ORAL | Status: DC
  Administered 2013-08-02 – 2013-08-05 (×4): 20 mg via ORAL
  Filled 2013-08-02 (×4): qty 1

## 2013-08-02 MED ORDER — DOCUSATE SODIUM 100 MG PO CAPS
100.0000 mg | ORAL_CAPSULE | Freq: Two times a day (BID) | ORAL | Status: DC
  Administered 2013-08-03 – 2013-08-05 (×5): 100 mg via ORAL

## 2013-08-02 MED ORDER — EPHEDRINE SULFATE 50 MG/ML IJ SOLN
INTRAMUSCULAR | Status: AC
  Filled 2013-08-02: qty 1

## 2013-08-02 MED ORDER — LACTATED RINGERS IV SOLN
INTRAVENOUS | Status: DC | PRN
  Administered 2013-08-02 (×3): via INTRAVENOUS

## 2013-08-02 MED ORDER — METOCLOPRAMIDE HCL 10 MG PO TABS: 5.0000 mg | ORAL_TABLET | Freq: Three times a day (TID) | ORAL | Status: DC | PRN

## 2013-08-02 MED ORDER — ONDANSETRON HCL 4 MG/2ML IJ SOLN
4.0000 mg | Freq: Four times a day (QID) | INTRAMUSCULAR | Status: DC | PRN
Start: 2013-08-02 — End: 2013-08-05
  Administered 2013-08-03: 4 mg via INTRAVENOUS
  Filled 2013-08-02: qty 2

## 2013-08-02 MED ORDER — DIPHENHYDRAMINE HCL 25 MG PO CAPS: 25.0000 mg | ORAL_CAPSULE | Freq: Four times a day (QID) | ORAL | Status: DC | PRN

## 2013-08-02 MED ORDER — SODIUM CHLORIDE 0.9 % IJ SOLN
INTRAMUSCULAR | Status: AC
  Filled 2013-08-02: qty 20

## 2013-08-02 MED ORDER — SODIUM CHLORIDE 0.9 % IR SOLN
Status: DC | PRN
  Administered 2013-08-02: 3000 mL

## 2013-08-02 MED ORDER — LIDOCAINE HCL (CARDIAC) 20 MG/ML IV SOLN
INTRAVENOUS | Status: AC
  Filled 2013-08-02: qty 5

## 2013-08-02 MED ORDER — BUPIVACAINE HCL (PF) 0.25 % IJ SOLN
INTRAMUSCULAR | Status: AC
  Filled 2013-08-02: qty 30

## 2013-08-02 MED ORDER — KETAMINE HCL 10 MG/ML IJ SOLN
INTRAMUSCULAR | Status: AC
  Filled 2013-08-02: qty 1

## 2013-08-02 MED ORDER — LIDOCAINE HCL (PF) 2 % IJ SOLN
INTRAMUSCULAR | Status: AC
  Filled 2013-08-02: qty 20

## 2013-08-02 MED ORDER — PROPOFOL INFUSION 10 MG/ML OPTIME
INTRAVENOUS | Status: DC | PRN
  Administered 2013-08-02: 50 ug/kg/min via INTRAVENOUS

## 2013-08-02 MED ORDER — SODIUM CHLORIDE 0.9 % IR SOLN
Status: DC | PRN
  Administered 2013-08-02: 1000 mL

## 2013-08-02 MED ORDER — MIDAZOLAM HCL 2 MG/2ML IJ SOLN
INTRAMUSCULAR | Status: AC
  Filled 2013-08-02: qty 2

## 2013-08-02 MED ORDER — LACTATED RINGERS IV SOLN: INTRAVENOUS | Status: DC

## 2013-08-02 MED ORDER — POLYETHYLENE GLYCOL 3350 17 G PO PACK
17.0000 g | PACK | Freq: Two times a day (BID) | ORAL | Status: DC
  Administered 2013-08-02 – 2013-08-05 (×5): 17 g via ORAL

## 2013-08-02 MED ORDER — METHOCARBAMOL 500 MG PO TABS
500.0000 mg | ORAL_TABLET | Freq: Four times a day (QID) | ORAL | Status: DC | PRN
  Administered 2013-08-03 – 2013-08-05 (×6): 500 mg via ORAL
  Filled 2013-08-02 (×7): qty 1

## 2013-08-02 MED ORDER — BUPIVACAINE HCL (PF) 0.75 % IJ SOLN
INTRAMUSCULAR | Status: DC
  Administered 2013-08-02 – 2013-08-03 (×2): via EPIDURAL
  Filled 2013-08-02 (×8): qty 25

## 2013-08-02 MED ORDER — ZOLPIDEM TARTRATE 10 MG PO TABS: 5.0000 mg | ORAL_TABLET | Freq: Every evening | ORAL | Status: DC | PRN

## 2013-08-02 MED ORDER — PHENOL 1.4 % MT LIQD
1.0000 | OROMUCOSAL | Status: DC | PRN
Start: 2013-08-02 — End: 2013-08-05

## 2013-08-02 MED ORDER — MAGNESIUM CITRATE PO SOLN: 1.0000 | Freq: Once | ORAL | Status: AC | PRN

## 2013-08-02 MED ORDER — CLINDAMYCIN PHOSPHATE 900 MG/50ML IV SOLN
INTRAVENOUS | Status: AC
  Filled 2013-08-02: qty 50

## 2013-08-02 MED ORDER — EPINEPHRINE HCL 1 MG/ML IJ SOLN
INTRAMUSCULAR | Status: AC
  Filled 2013-08-02: qty 1

## 2013-08-02 MED ORDER — DEXAMETHASONE SODIUM PHOSPHATE 10 MG/ML IJ SOLN
10.0000 mg | Freq: Once | INTRAMUSCULAR | Status: DC
  Filled 2013-08-02 (×2): qty 1

## 2013-08-02 SURGICAL SUPPLY — 60 items
ADH SKN CLS APL DERMABOND .7 (GAUZE/BANDAGES/DRESSINGS) ×2
BAG SPEC THK2 15X12 ZIP CLS (MISCELLANEOUS) ×1
BAG ZIPLOCK 12X15 (MISCELLANEOUS) ×3 IMPLANT
BANDAGE ELASTIC 6 VELCRO ST LF (GAUZE/BANDAGES/DRESSINGS) ×6 IMPLANT
BANDAGE ESMARK 6X9 LF (GAUZE/BANDAGES/DRESSINGS) ×1 IMPLANT
BLADE SAG 13.0X1.37X90 (BLADE) ×4 IMPLANT
BLADE SAW RECIPROCATING 77.5 (BLADE) ×4 IMPLANT
BLADE SAW SGTL 13.0X1.19X90.0M (BLADE) ×6 IMPLANT
BNDG CMPR 9X6 STRL LF SNTH (GAUZE/BANDAGES/DRESSINGS) ×1
BNDG COHESIVE 4X5 TAN STRL (GAUZE/BANDAGES/DRESSINGS) ×1 IMPLANT
BNDG ESMARK 6X9 LF (GAUZE/BANDAGES/DRESSINGS) ×3
BOWL SMART MIX CTS (DISPOSABLE) ×6 IMPLANT
CAP KNEE ATTUNE RP ×4 IMPLANT
CEMENT HV SMART SET (Cement) ×12 IMPLANT
CUFF TOURN SGL QUICK 34 (TOURNIQUET CUFF) ×6
CUFF TRNQT CYL 34X4X40X1 (TOURNIQUET CUFF) ×2 IMPLANT
DERMABOND ADVANCED (GAUZE/BANDAGES/DRESSINGS) ×4
DERMABOND ADVANCED .7 DNX12 (GAUZE/BANDAGES/DRESSINGS) ×2 IMPLANT
DRAPE EXTREMITY BILATERAL (DRAPE) ×3 IMPLANT
DRAPE POUCH INSTRU U-SHP 10X18 (DRAPES) ×3 IMPLANT
DRAPE U-SHAPE 47X51 STRL (DRAPES) ×9 IMPLANT
DRSG AQUACEL AG ADV 3.5X10 (GAUZE/BANDAGES/DRESSINGS) ×6 IMPLANT
DRSG TEGADERM 4X4.75 (GAUZE/BANDAGES/DRESSINGS) ×2 IMPLANT
DURAPREP 26ML APPLICATOR (WOUND CARE) ×9 IMPLANT
ELECT REM PT RETURN 9FT ADLT (ELECTROSURGICAL) ×3
ELECTRODE REM PT RTRN 9FT ADLT (ELECTROSURGICAL) ×1 IMPLANT
EVACUATOR 1/8 PVC DRAIN (DRAIN) IMPLANT
GAUZE SPONGE 2X2 8PLY STRL LF (GAUZE/BANDAGES/DRESSINGS) ×2 IMPLANT
GLOVE BIOGEL PI IND STRL 7.5 (GLOVE) ×1 IMPLANT
GLOVE BIOGEL PI IND STRL 8 (GLOVE) ×1 IMPLANT
GLOVE BIOGEL PI INDICATOR 7.5 (GLOVE) ×2
GLOVE BIOGEL PI INDICATOR 8 (GLOVE) ×2
GLOVE ECLIPSE 8.0 STRL XLNG CF (GLOVE) IMPLANT
GLOVE ORTHO TXT STRL SZ7.5 (GLOVE) ×6 IMPLANT
GOWN SPEC L3 XXLG W/TWL (GOWN DISPOSABLE) ×6 IMPLANT
GOWN STRL REUS W/TWL LRG LVL3 (GOWN DISPOSABLE) ×3 IMPLANT
HANDPIECE INTERPULSE COAX TIP (DISPOSABLE) ×3
IMMOBILIZER KNEE 20 (SOFTGOODS) ×2 IMPLANT
KIT BASIN OR (CUSTOM PROCEDURE TRAY) ×3 IMPLANT
MANIFOLD NEPTUNE II (INSTRUMENTS) ×3 IMPLANT
NS IRRIG 1000ML POUR BTL (IV SOLUTION) ×4 IMPLANT
PACK TOTAL JOINT (CUSTOM PROCEDURE TRAY) ×3 IMPLANT
PADDING CAST COTTON 6X4 STRL (CAST SUPPLIES) IMPLANT
POSITIONER SURGICAL ARM (MISCELLANEOUS) IMPLANT
SET HNDPC FAN SPRY TIP SCT (DISPOSABLE) ×1 IMPLANT
SET PAD KNEE POSITIONER (MISCELLANEOUS) ×6 IMPLANT
SPONGE GAUZE 2X2 STER 10/PKG (GAUZE/BANDAGES/DRESSINGS)
SPONGE LAP 18X18 X RAY DECT (DISPOSABLE) ×1 IMPLANT
STOCKINETTE 8 INCH (MISCELLANEOUS) ×3 IMPLANT
SUCTION FRAZIER 12FR DISP (SUCTIONS) ×3 IMPLANT
SUT MNCRL AB 4-0 PS2 18 (SUTURE) ×6 IMPLANT
SUT VIC AB 1 CT1 27 (SUTURE) ×6
SUT VIC AB 1 CT1 27XBRD ANTBC (SUTURE) ×2 IMPLANT
SUT VIC AB 2-0 CT1 27 (SUTURE) ×18
SUT VIC AB 2-0 CT1 TAPERPNT 27 (SUTURE) ×6 IMPLANT
SUT VLOC 180 0 24IN GS25 (SUTURE) ×6 IMPLANT
TOWEL OR 17X26 10 PK STRL BLUE (TOWEL DISPOSABLE) ×6 IMPLANT
TRAY FOLEY CATH 14FRSI W/METER (CATHETERS) ×3 IMPLANT
WATER STERILE IRR 1500ML POUR (IV SOLUTION) ×5 IMPLANT
WRAP KNEE MAXI GEL POST OP (GAUZE/BANDAGES/DRESSINGS) ×6 IMPLANT

## 2013-08-02 NOTE — Anesthesia Postprocedure Evaluation (Signed)
  Anesthesia Post-op Note  Patient: [name]  Procedure(s) Performed: Procedure(s) (LRB): TOTAL KNEE BILATERAL (Bilateral)  Patient Location: PACU  Anesthesia Type: Epidural  Level of Consciousness: awake and alert   Airway and Oxygen Therapy: Patient Spontanous Breathing  Post-op Pain: mild  Post-op Assessment: Post-op Vital signs reviewed, Patient's Cardiovascular Status Stable, Respiratory Function Stable, Patent Airway and No signs of Nausea or vomiting  Last Vitals:  Filed Vitals:   08/02/13 1544  BP: 100/59  Pulse: 41  Temp: 36.3 C  Resp: 14    Post-op Vital Signs: stable   Complications: No apparent anesthesia complications

## 2013-08-02 NOTE — Progress Notes (Signed)
During epidural assessment the dressing around the epidural site was stained with new drainage.  This drainage was not present at 1930 assessment.  Dr. Marcell Barlow called, epidural providing patient with pain relief, no new orders, advised to continue monitoring.  Will continue monitoring.

## 2013-08-02 NOTE — Addendum Note (Signed)
Addendum created 08/02/13 1738 by Montez Hageman, MD   Modules edited: Clinical Notes, Orders   Clinical Notes:  File: [id number]

## 2013-08-02 NOTE — Transfer of Care (Signed)
Immediate Anesthesia Transfer of Care Note  Patient: [name]  Procedure(s) Performed: Procedure(s) (LRB): TOTAL KNEE BILATERAL (Bilateral)  Patient Location: PACU  Anesthesia Type: Epidural  Level of Consciousness: sedated, patient cooperative and responds to stimulation  Airway & Oxygen Therapy: Patient Spontanous Breathing and Patient connected to face mask oxgen  Post-op Assessment: Report given to PACU RN and Post -op Vital signs reviewed and stable  Post vital signs: Reviewed and stable  Complications: No apparent anesthesia complications

## 2013-08-02 NOTE — Interval H&P Note (Signed)
History and Physical Interval Note:  08/02/2013 9:05 AM  [name]  has presented today for surgery, with the diagnosis of osteoarthritis both knees  The various methods of treatment have been discussed with the patient and family. After consideration of risks, benefits and other options for treatment, the patient has consented to  Procedure(s): TOTAL KNEE BILATERAL (Bilateral) as a surgical intervention .  The patient's history has been reviewed, patient examined, no change in status, stable for surgery.  I have reviewed the patient's chart and labs.  Questions were answered to the patient's satisfaction.     Mauri Pole

## 2013-08-02 NOTE — Progress Notes (Signed)
Montez Hageman, MD called about patient's increasing pain level, and informed about medications already given.  MD gave bolus dose (48mL) of 0.25% bupiv at 1730.   RN followed orders for monitoring vital signs.

## 2013-08-02 NOTE — Anesthesia Preprocedure Evaluation (Addendum)
Anesthesia Evaluation  Patient identified by MRN, date of birth, ID band Patient awake    Reviewed: Allergy & Precautions, H&P , NPO status , Patient's Chart, lab work & pertinent test results  Airway Mallampati: II TM Distance: >3 FB Neck ROM: Full    Dental no notable dental hx.    Pulmonary neg pulmonary ROS, sleep apnea ,  breath sounds clear to auscultation  Pulmonary exam normal       Cardiovascular negative cardio ROS  Rhythm:Regular Rate:Normal  History of bradycardia to 20's during previous anesthetic. Cardiology work-up negative   Neuro/Psych negative neurological ROS  negative psych ROS   GI/Hepatic negative GI ROS, Neg liver ROS,   Endo/Other  negative endocrine ROSMorbid obesity  Renal/GU negative Renal ROS  negative genitourinary   Musculoskeletal negative musculoskeletal ROS (+)   Abdominal   Peds negative pediatric ROS (+)  Hematology negative hematology ROS (+)   Anesthesia Other Findings   Reproductive/Obstetrics negative OB ROS                          Anesthesia Physical Anesthesia Plan  ASA: III  Anesthesia Plan: Epidural   Post-op Pain Management:    Induction:   Airway Management Planned: Simple Face Mask  Additional Equipment:   Intra-op Plan:   Post-operative Plan:   Informed Consent: I have reviewed the patients History and Physical, chart, labs and discussed the procedure including the risks, benefits and alternatives for the proposed anesthesia with the patient or authorized representative who has indicated his/her understanding and acceptance.   Dental advisory given  Plan Discussed with: CRNA  Anesthesia Plan Comments:         Anesthesia Quick Evaluation

## 2013-08-02 NOTE — Op Note (Signed)
NAME:  [name]                      MEDICAL RECORD NO.:  [id number]                             FACILITY:  [facility]      PHYSICIAN:  Pietro Cassis. Alvan Dame, M.D.  DATE OF BIRTH:  1971/02/14      DATE OF PROCEDURE:  08/02/2013                                     OPERATIVE REPORT         PREOPERATIVE DIAGNOSIS:  Bilateral knee osteoarthritis.      POSTOPERATIVE DIAGNOSIS:  Bilateral knee osteoarthritis.      FINDINGS:  The patient was noted to have complete loss of cartilage and   bone-on-bone arthritis with associated osteophytes in the medial and patellofemoral compartments of both  knees with a significant synovitis and associated effusion.      PROCEDURE:  Bilateral total knee replacement.  Same procedure carried out for each knee      COMPONENTS USED:  DePuy rotating platform posterior stabilized knee   system, a size 6N femurs, 6 tibia, 12 mm AOX PS insert on the left, 14 mm AOX PS insert on the right, and 35 patellar   buttons.      SURGEON:  Pietro Cassis. Alvan Dame, M.D.      ASSISTANT:  Danae Orleans, PA-C.      ANESTHESIA:  Epidural.      SPECIMENS:  None.      COMPLICATION:  None.      DRAINS:  One Hemovac.  EBL: <200cc total      TOURNIQUET TIME:  40 minutes in the left, 48 min on the right and 240mmHG .      The patient was stable to the recovery room.      INDICATION FOR PROCEDURE:  [name] is a 43 y.o. female patient of   mine.  The patient had been seen, evaluated, and treated conservatively in the   office with medication, activity modification, and injections.  The patient had   radiographic changes of bone-on-bone arthritis with endplate sclerosis and osteophytes noted.      The patient failed conservative measures including medication, injections, and activity modification, and at this point was ready for more definitive measures.   Based on the radiographic changes and failed conservative measures, the patient   decided to proceed with total knee  replacement.  Risks of infection,   DVT, component failure, need for revision surgery, postop course, and   expectations were all   discussed and reviewed.  Consent was obtained for benefit of pain   relief.      PROCEDURE IN DETAIL:  The patient was brought to the operative theater.   Once adequate anesthesia, preoperative antibiotics, 900mg  of Cleocin administered, the patient was positioned supine with the tourniquets placed on both knees.  Both  lower extremities were then prepped and draped in sterile fashion.  A time-   out was performed identifying the patient, planned procedures, and   extremities.      The both lower extremities was placed in the [facility] leg holders.   Attention was directed to the left leg first.  The procedures noted as below were carried the same  for both legs.  The only difference is the thickness of the polyethylene on the left was a 12 and on the right a 14. The left leg was exsanguinated, tourniquet elevated to 250 mmHg.  A midline incision was   made followed by median parapatellar arthrotomy.  Following initial   exposure, attention was first directed to the patella.  Precut   measurement was noted to be 23 mm.  I resected down to 14 mm and used a   35 anatomic patellar button to restore patellar height as well as cover the cut   surface.      The lug holes were drilled and a metal shim was placed to protect the   patella from retractors and saw blades.      At this point, attention was now directed to the femur.  The femoral   canal was opened with a drill, irrigated to try to prevent fat emboli.  An   intramedullary rod was passed at 5 degrees valgus, 9 mm of bone was   resected off the distal femur.  Following this resection, the tibia was   subluxated anteriorly.  Using the extramedullary guide, 4 mm of bone was resected off   the proximal medial tibia.  We confirmed the gap would be   stable medially and laterally with a size 7 insert as well as  confirmed   the cut was perpendicular in the coronal plane, checking with an alignment rod.      Once this was done, I sized the femur to be a size 6 in the anterior-   posterior dimension, chose a narrow component based on medial and   lateral dimension.  The size 6 rotation block was then pinned in   position anterior referenced using the tensioning device to set both rotation and to match extension and flexion gaps.  The   anterior, posterior, and  chamfer cuts were made without difficulty making certain that I was along the anterior cortex to help   with flexion gap stability.      The final box cut was made off the lateral aspect of distal femur.      At this point, the tibia was sized to be a size 6, the size 6 tray was   then pinned in position through the medial third of the tubercle,   drilled, and keel punched.  Trial reduction was now carried with a 6 femur,  6 tibia, a 12 mm insert, and the 35 patella botton.  The knee was brought to   extension, full extension with good flexion stability with the patella   tracking through the trochlea without application of pressure.  Given   all these findings, the trial components removed.  Final components were   opened and cement was mixed.  The knee was irrigated with normal saline   solution and pulse lavage.     The knee was irrigated.  Final implants were then cemented onto clean and   dried cut surfaces of bone with the knee brought to extension with a 12 mm trial insert on the left and a size 38mm insert on the right.      Once the cement had fully cured, the excess cement was removed   throughout the knee.  I confirmed I was satisfied with the range of   motion and stability, and the final 12 mm insert on the left and 14 mm on the right chosen.  It was   placed  into the knee.      The tourniquet had been let down at 40 minutes on the left and 48 minutes on the right.  No significant   hemostasis required.  The extensor  mechanism was then reapproximated using #1 Vicryl and #0 V-lock sutures with the knees  in flexion.  The   remaining wound was closed with 2-0 Vicryl and running 4-0 Monocryl.   The knee was cleaned, dried, dressed sterilely using Dermabond and   Aquacel dressing.  The patient was then   brought to recovery room in stable condition, tolerating the procedure   well.   Please note that Physician Assistant, Danae Orleans, PA-C, was present for the entirety of the case, and was utilized for pre-operative positioning, peri-operative retractor management, general facilitation of the procedure.  He was also utilized for primary wound closure at the end of the case.              Pietro Cassis Alvan Dame, M.D.    08/02/2013 12:36 PM

## 2013-08-02 NOTE — Anesthesia Procedure Notes (Signed)
Epidural Patient location during procedure: holding area  Staffing Anesthesiologist: Montez Hageman Performed by: anesthesiologist   Preanesthetic Checklist Completed: patient identified, site marked, surgical consent, pre-op evaluation, timeout performed, IV checked, risks and benefits discussed, monitors and equipment checked and post-op pain management  Epidural Patient position: sitting Prep: Betadine Patient monitoring: heart rate, continuous pulse ox and blood pressure Approach: right paramedian Location: L3-L4 Injection technique: LOR saline  Needle:  Needle type: Hustead  Needle gauge: 18 G Needle length: 9 cm and 9 Needle insertion depth: 5 cm Catheter type: closed end flexible Catheter size: 20 Guage Catheter at skin depth: 10 cm Test dose: negative and 1.5% lidocaine  Assessment Events: blood not aspirated, no injection resistance, negative IV test and no paresthesia  Additional Notes Test dose 1.5% Lidocaine with epi 1:200,000, 3cc  Patient tolerated the insertion well without complications.Reason for block:post-op pain management

## 2013-08-02 NOTE — Progress Notes (Signed)
pacu nursing:  Dr. Marcell Barlow in to see patient.  Okay to transfer to floor.

## 2013-08-02 NOTE — Progress Notes (Signed)
CTSP for discomfort despite epidural. 5/10 pain bilateral. On 14cc/hr of Bupiv 0.1%.  Temperature testing reveals T10 level bilat. Suspect solution is too weak.  Bolus of 7cc 0.25% bupiv given. Will switch infusion to 0.125%  [employer]

## 2013-08-03 ENCOUNTER — Encounter (HOSPITAL_COMMUNITY): Payer: Self-pay | Admitting: Orthopedic Surgery

## 2013-08-03 LAB — CBC
HCT: 31.6 % — ABNORMAL LOW (ref 36.0–46.0)
HEMOGLOBIN: 10.9 g/dL — AB (ref 12.0–15.0)
MCH: 31.2 pg (ref 26.0–34.0)
MCHC: 34.5 g/dL (ref 30.0–36.0)
MCV: 90.5 fL (ref 78.0–100.0)
Platelets: 238 10*3/uL (ref 150–400)
RBC: 3.49 MIL/uL — ABNORMAL LOW (ref 3.87–5.11)
RDW: 13.5 % (ref 11.5–15.5)
WBC: 11.5 10*3/uL — ABNORMAL HIGH (ref 4.0–10.5)

## 2013-08-03 LAB — BASIC METABOLIC PANEL
BUN: 10 mg/dL (ref 6–23)
CALCIUM: 8 mg/dL — AB (ref 8.4–10.5)
CO2: 22 mEq/L (ref 19–32)
Chloride: 105 mEq/L (ref 96–112)
Creatinine, Ser: 0.5 mg/dL (ref 0.50–1.10)
GLUCOSE: 138 mg/dL — AB (ref 70–99)
POTASSIUM: 3.9 meq/L (ref 3.7–5.3)
SODIUM: 138 meq/L (ref 137–147)

## 2013-08-03 MED ORDER — PROMETHAZINE HCL 25 MG/ML IJ SOLN
6.2500 mg | Freq: Four times a day (QID) | INTRAMUSCULAR | Status: DC | PRN
  Administered 2013-08-03: 6.25 mg via INTRAVENOUS
  Administered 2013-08-03: 12.5 mg via INTRAVENOUS
  Filled 2013-08-03 (×2): qty 1

## 2013-08-03 MED ORDER — SODIUM CHLORIDE 0.9 % IV BOLUS (SEPSIS)
500.0000 mL | Freq: Once | INTRAVENOUS | Status: AC
  Administered 2013-08-03: 500 mL via INTRAVENOUS

## 2013-08-03 MED ORDER — RIVAROXABAN 10 MG PO TABS
10.0000 mg | ORAL_TABLET | Freq: Every day | ORAL | Status: DC
  Administered 2013-08-03 – 2013-08-05 (×3): 10 mg via ORAL
  Filled 2013-08-03 (×3): qty 1

## 2013-08-03 MED ORDER — KETOROLAC TROMETHAMINE 15 MG/ML IJ SOLN
7.5000 mg | Freq: Four times a day (QID) | INTRAMUSCULAR | Status: DC
  Administered 2013-08-03 – 2013-08-05 (×9): 7.5 mg via INTRAVENOUS
  Filled 2013-08-03 (×16): qty 1

## 2013-08-03 MED ORDER — OXYCODONE HCL 5 MG PO TABS
5.0000 mg | ORAL_TABLET | ORAL | Status: DC
  Administered 2013-08-03 – 2013-08-05 (×16): 15 mg via ORAL
  Filled 2013-08-03 (×16): qty 3

## 2013-08-03 MED ORDER — HYDROMORPHONE HCL PF 2 MG/ML IJ SOLN
2.0000 mg | Freq: Once | INTRAMUSCULAR | Status: AC
  Administered 2013-08-03: 2 mg via INTRAVENOUS

## 2013-08-03 NOTE — Progress Notes (Signed)
Pt states she is not ready to be placed on CPAP at this time. Pt states her daughter will help with mask application and operating the CPAP. Once on CPAP pt will be on auto titrate (min-5 & max-15cmH2O). Sterile water was added for humidification. Pt was made aware if she should need any assistance throughout the night to contact RT.

## 2013-08-03 NOTE — Progress Notes (Signed)
Clinical Social Work Department BRIEF PSYCHOSOCIAL ASSESSMENT 08/03/2013  Patient:  [facility]     Account Number:  [id number]     Admit date:  08/02/2013  Clinical Social Worker:  Lacie Scotts  Date/Time:  08/03/2013 12:55 PM  Referred by:  Physician  Date Referred:  08/03/2013 Referred for  SNF Placement   Other Referral:   Interview type:  Patient Other interview type:    PSYCHOSOCIAL DATA Living Status:  HUSBAND Admitted from facility:   Level of care:   Primary support name:  Herbie Baltimore Primary support relationship to patient:  SPOUSE Degree of support available:   supportive    CURRENT CONCERNS Current Concerns  Post-Acute Placement   Other Concerns:    SOCIAL WORK ASSESSMENT / PLAN Pt is a 43 yr old female living at home prior to hospitalization. CSW met with pt to assist with d/c planning. This is a planned admission. Pt is hoping to have her rehab at [facility]. If CIR isn't able to assist pt is willing to consider ST SNF placement. SNF search has been initiated and bed offers will be provided. CSW will continue to follow until d/c plan has been determined.   Assessment/plan status:  Psychosocial Support/Ongoing Assessment of Needs Other assessment/ plan:   Information/referral to community resources:   Insurance coverage for SNF placement and ambulance transport reviewed.    PATIENT'S/FAMILY'S RESPONSE TO PLAN OF CARE: Pt is motivated to work with therapy. She originally had hoped to return home following her bilateral knee surgery but know realizes rehab is needed.   Werner Lean LCSW [phone]

## 2013-08-03 NOTE — Progress Notes (Signed)
Dr. Marcell Barlow notified of patient's asymptomatic hypotension and new drainage noted at epidural site.  No new orders received.  Will continue to monitor.

## 2013-08-03 NOTE — Progress Notes (Signed)
Utilization review completed.  

## 2013-08-03 NOTE — Addendum Note (Signed)
Addendum created 08/03/13 1148 by Ayesha Mohair, MD   Modules edited: Clinical Notes   Clinical Notes:  File: [id number]

## 2013-08-03 NOTE — Progress Notes (Signed)
Rehab Admissions Coordinator Note:  Patient was screened by [name] for appropriateness for an Inpatient Acute Rehab Consult.  At this time, we are recommending Inpatient Rehab consult.  [name] Demetreus Lothamer 08/03/2013, 2:11 PM  I can be reached at [phone].

## 2013-08-03 NOTE — Progress Notes (Signed)
RT placed patient on CPAP with home setting of auto titraite  ( min 6 and max 15 cmH2O ). Sterile water added to water chamber for humidification. Patient tolerating well. RT will continue to monitor as needed.

## 2013-08-03 NOTE — Progress Notes (Signed)
Epidural catheter discontinued and removed intact. Poor post-operative analgesia last PM. Surgeon requested that epidural be discontinued this AM. Sterile dressing applied upon removal of catheter. No complications noted.

## 2013-08-03 NOTE — Evaluation (Signed)
Physical Therapy Evaluation Patient Details Name: [name] MRN: [id number] DOB: 19-Oct-1970 Today's Date: 08/03/2013   History of Present Illness  Pt is a 43 year old female s/p bilateral TKRs with hx of morbid obesity.  Clinical Impression  Pt is s/p bilateral TKAs resulting in the deficits listed below (see PT Problem List).  Pt will benefit from skilled PT to increase their independence and safety with mobility to allow discharge to the venue listed below.  Pt with limited mobility this session due to increased pain however agreeable to mobilize as tolerated and assisted to recliner.     Follow Up Recommendations CIR    Equipment Recommendations  Rolling walker with 5" wheels (wide)    Recommendations for Other Services Rehab consult     Precautions / Restrictions Precautions Precautions: Fall;Knee Restrictions Other Position/Activity Restrictions: WBAT      Mobility  Bed Mobility Overal bed mobility: Needs Assistance Bed Mobility: Supine to Sit     Supine to sit: Mod assist;+2 for safety/equipment;HOB elevated     General bed mobility comments: verbal cues for technique and scooting, assist for LEs  Transfers Overall transfer level: Needs assistance Equipment used: Rolling walker (2 wheeled) Transfers: Sit to/from [employer] Transfers Sit to Stand: +2 physical assistance;Max assist;From elevated surface Stand pivot transfers: +2 physical assistance;Mod assist       General transfer comment: verbal cues for technique including UE and LE placement, attempted standing x3, difficulty bringing weight over BOS  Ambulation/Gait                Stairs            Wheelchair Mobility    Modified Rankin (Stroke Patients Only)       Balance                                             Pertinent Vitals/Pain Premedicated PO 45 prior to session, activity to tolerance, c/o max knee pain with mobility, ice packs applied     Home Living Family/patient expects to be discharged to:: Inpatient rehab Living Arrangements: Spouse/significant other;Children             Home Equipment: Cane - single point      Prior Function Level of Independence: Independent               Hand Dominance        Extremity/Trunk Assessment               Lower Extremity Assessment: RLE deficits/detail;LLE deficits/detail RLE Deficits / Details: reports full sensation bilaterally, functional AAROM sitting EOB knee flexion 40* LLE Deficits / Details: reports full sensation bilaterally, functional AAROM sitting EOB knee flexion 40*     Communication   Communication: No difficulties  Cognition Arousal/Alertness: Awake/alert Behavior During Therapy: WFL for tasks assessed/performed Overall Cognitive Status: Within Functional Limits for tasks assessed                      General Comments      Exercises        Assessment/Plan    PT Assessment Patient needs continued PT services  PT Diagnosis Difficulty walking;Acute pain   PT Problem List Decreased strength;Decreased range of motion;Decreased mobility;Decreased activity tolerance;Decreased knowledge of use of DME;Pain;Decreased knowledge of precautions;Obesity  PT Treatment Interventions Gait training;DME instruction;Functional mobility training;Therapeutic activities;Therapeutic exercise;Patient/family  education   PT Goals (Current goals can be found in the Care Plan section) Acute Rehab PT Goals PT Goal Formulation: With patient Time For Goal Achievement: 08/10/13 Potential to Achieve Goals: Good    Frequency 7X/week   Barriers to discharge        Co-evaluation               End of Session Equipment Utilized During Treatment: Gait belt Activity Tolerance: Patient limited by fatigue;Patient limited by pain Patient left: in chair;with call bell/phone within reach;with family/visitor present Nurse Communication: Mobility  status;Need for lift equipment         Time: 4315-4008 PT Time Calculation (min): 24 min   Charges:   PT Evaluation $Initial PT Evaluation Tier I: 1 Procedure PT Treatments $Therapeutic Activity: 23-37 mins   PT G Codes:          Junius Argyle 08/03/2013, 1:25 PM Carmelia Bake, PT, DPT 08/03/2013 Pager: [phone]

## 2013-08-03 NOTE — Progress Notes (Signed)
Physical Therapy Treatment   08/03/13 1600  PT Visit Information  Last PT Received On 08/03/13  Assistance Needed +2  History of Present Illness Pt is a 43 year old female s/p bilateral TKRs with hx of morbid obesity.  PT Time Calculation  PT Start Time 1508  PT Stop Time 1532  PT Time Calculation (min) 24 min  Subjective Data  Subjective Pt assisted back to bed with RN present and then performed exercises in supine.  Ice packs applied end of session.  Precautions  Precautions Fall;Knee  Restrictions  Other Position/Activity Restrictions WBAT  Cognition  Arousal/Alertness Awake/alert  Behavior During Therapy WFL for tasks assessed/performed  Overall Cognitive Status Within Functional Limits for tasks assessed  Bed Mobility  Overal bed mobility Needs Assistance  Bed Mobility Sit to Supine  Sit to supine Mod assist;+2 for safety/equipment  General bed mobility comments assist for LEs onto bed, pt does well with scooting hips and moving upper body to reposition  Transfers  Overall transfer level Needs assistance  Equipment used Rolling walker (2 wheeled)  Transfers Sit to/from Stand;Stand Pivot Transfers  Sit to Stand +2 physical assistance;Max assist;From elevated surface  Stand pivot transfers +2 physical assistance;Mod assist  General transfer comment verbal cues for technique, able to rise on 2nd attempt this visit, increased assist to rise and steady, pt did better with assisting weight over BOS  Exercises  Exercises Total Joint  Total Joint Exercises  Ankle Circles/Pumps AROM;Both;10 reps  Quad Sets AROM;Both;10 reps  Short Arc [employer];Both;10 reps  Heel Slides AAROM;10 reps;Both  Hip ABduction/ADduction AROM;Both;10 reps  Goniometric ROM R knee flexion AAROM 60*, L AAROM knee flexion 55* however more painful  PT - End of Session  Equipment Utilized During Treatment Gait belt  Activity Tolerance Patient limited by pain  Patient left in bed;with call bell/phone  within reach;with family/visitor present  PT - Assessment/Plan  PT Plan Current plan remains appropriate  PT Frequency 7X/week  Recommendations for Other Services Rehab consult  Follow Up Recommendations CIR  PT equipment Rolling walker with 5" wheels (wide)  PT Goal Progression  Progress towards PT goals Progressing toward goals  PT General Charges  $$ ACUTE PT VISIT 1 Procedure  PT Treatments  $Therapeutic Exercise 8-22 mins  $Therapeutic Activity 8-22 mins   Carmelia Bake, PT, DPT 08/03/2013 Pager: [phone]

## 2013-08-03 NOTE — Progress Notes (Signed)
Patient ID: [name], female   DOB: 12/14/1970, 43 y.o.   MRN: [id number] Subjective: 1 Day Post-Op Procedure(s) (LRB): TOTAL KNEE BILATERAL (Bilateral)    Patient reports pain as moderate to advanced.  Epidural not very effective, some concerns that it was leaking.  Otherwise no events  Objective:   VITALS:   Filed Vitals:   08/03/13 0931  BP: 93/55  Pulse: 44  Temp:   Resp:     Neurovascular intact Incision: dressing C/D/I  LABS  Recent Labs  08/03/13 0435  HGB 10.9*  HCT 31.6*  WBC 11.5*  PLT 238     Recent Labs  08/03/13 0435  NA 138  K 3.9  BUN 10  CREATININE 0.50  GLUCOSE 138*    No results found for this basename: LABPT, INR,  in the last 72 hours   Assessment/Plan: 1 Day Post-Op Procedure(s) (LRB): TOTAL KNEE BILATERAL (Bilateral)   Advance diet Up with therapy  Will have epidural removed today to get adjusted to pain level D/C foley after 6 hours Adjust pain meds as needed Add toradol if persistent pain issues  CIR consult to help determine discharge plans

## 2013-08-03 NOTE — Progress Notes (Signed)
Anesthesiologist called at 0730 this morning about patients blood pressure, pain level, increased difficulty breathing, and sensory level.   BP 78/35, HR 64. RR 14, 98-100% on 2L nasal cannula, Co2 monitor reading of 41. (patient also has sleep apnea)  Patient states "My chest feels heavy". RN communicated this information to the MD.  Dr. Winfred Leeds gave RN orders to stop epidural infusion, give a 500 mL fluid bolus, and notify him of her blood pressure in an hour after the bolus was complete.   Epidural infusion stopped at 0745, as such is documented in the patients MAR.  500 mL normal saline bolus started at 0752, and ran over an hour.   RN called MD at [phone] to inform him of patient's blood pressure at this time. BP 97/58  HR  58.  MD informed RN that he would be by to remove the epidural catheter as soon as he could.  RN continued to monitor during this time.

## 2013-08-04 DIAGNOSIS — D500 Iron deficiency anemia secondary to blood loss (chronic): Secondary | ICD-10-CM | POA: Diagnosis not present

## 2013-08-04 LAB — CBC
HCT: 32 % — ABNORMAL LOW (ref 36.0–46.0)
HEMOGLOBIN: 10.3 g/dL — AB (ref 12.0–15.0)
MCH: 29.8 pg (ref 26.0–34.0)
MCHC: 32.2 g/dL (ref 30.0–36.0)
MCV: 92.5 fL (ref 78.0–100.0)
PLATELETS: 215 10*3/uL (ref 150–400)
RBC: 3.46 MIL/uL — AB (ref 3.87–5.11)
RDW: 13.9 % (ref 11.5–15.5)
WBC: 10.2 10*3/uL (ref 4.0–10.5)

## 2013-08-04 LAB — BASIC METABOLIC PANEL
BUN: 11 mg/dL (ref 6–23)
CALCIUM: 8.1 mg/dL — AB (ref 8.4–10.5)
CO2: 26 meq/L (ref 19–32)
Chloride: 104 mEq/L (ref 96–112)
Creatinine, Ser: 0.61 mg/dL (ref 0.50–1.10)
GFR calc Af Amer: >90 mL/min (ref >90)
GFR calc non Af Amer: >90 mL/min (ref >90)
Glucose, Bld: 118 mg/dL — ABNORMAL HIGH (ref 70–99)
POTASSIUM: 4.1 meq/L (ref 3.7–5.3)
SODIUM: 138 meq/L (ref 137–147)

## 2013-08-04 NOTE — Consult Note (Signed)
Physical Medicine and Rehabilitation Consult Reason for Consult: Bilateral total knee replacement Referring Physician: Dr.:Olin   HPI: [name] is a 43 y.o. right-handed female history of sleep apnea, and morbid obesity admitted 08/02/2013 with progressive x2-3 years bilateral knee pain secondary to end-stage osteoarthritis and no relief with conservative care including NSAIDs, corticosteroid injections and activity modification. 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 Underwent bilateral total knee replacement 08/02/2013 per Dr. Alvan Dame. Postoperative pain management. Weightbearing as tolerated bilateral lower extremities. Placed on Xarelto for DVT prophylaxis. Acute blood loss anemia 10.3 and monitored. Physical therapy evaluation completed 08/04/2011 with recommendations for physical medicine rehabilitation consult.  Review of Systems  Gastrointestinal: Positive for constipation.       GERD  Musculoskeletal: Positive for joint pain and myalgias.  All other systems reviewed and are negative.  Past Medical History  Diagnosis Date  . Arthritis   . Sleep apnea   . Obesity, Class III, BMI 40-49.9 (morbid obesity)   . Heart murmur   . Pneumonia     hx of  . Enlarged thyroid     "all tests are normal"  . GERD (gastroesophageal reflux disease)     with motrin  . Adrenal tumor     "monitoring"  . Anemia     hx of   Past Surgical History  Procedure Laterality Date  . Abdominal hysterectomy  03/2003    partial  . Tubal ligation    . Lipoma excision  01/11/2010  . Cholecystectomy  2012  . Total knee arthroplasty Bilateral 08/02/2013    Procedure: TOTAL KNEE BILATERAL;  Surgeon: Mauri Pole, MD;  Location: [facility];  Service: Orthopedics;  Laterality: Bilateral;   Family History  Problem Relation Age of Onset  . Breast cancer    . Pancreatic cancer      uncle  . Diabetes Mother   . Cancer Mother     breast  . Irritable bowel syndrome  Sister    Social History:  reports that she has never smoked. She has never used smokeless tobacco. She reports that she does not drink alcohol or use illicit drugs. Allergies:  Allergies  Allergen Reactions  . Sulfonamide Derivatives Anaphylaxis  . Levaquin [Levofloxacin]     Joint pain  . Penicillins Rash   Medications Prior to Admission  Medication Sig Dispense Refill  . acetaminophen (TYLENOL) 500 MG tablet Take 500 mg by mouth every 6 (six) hours as needed for mild pain.       . fexofenadine (ALLEGRA) 180 MG tablet Take 180 mg by mouth daily.      . fluticasone (FLONASE) 50 MCG/ACT nasal spray Place 1 spray into both nostrils daily.      Marland Kitchen ibuprofen (ADVIL,MOTRIN) 200 MG tablet Take 400 mg by mouth every 6 (six) hours as needed for mild pain.      Marland Kitchen lansoprazole (PREVACID) 15 MG capsule Take 15 mg by mouth daily.       . traMADol (ULTRAM) 50 MG tablet Take 50 mg by mouth every 6 (six) hours as needed for moderate pain.      Marland Kitchen zolpidem (AMBIEN) 10 MG tablet Take 10 mg by mouth at bedtime as needed for sleep.        Home: Home Living Family/patient expects to be discharged to:: Inpatient rehab Living Arrangements: Spouse/significant other;Children Home Equipment: [place] - single point  Functional History: Prior Function Level of Independence: Independent Functional Status:  Mobility: Bed Mobility Overal  bed mobility: Needs Assistance Bed Mobility: Sit to Supine Supine to sit: Mod assist;+2 for safety/equipment;HOB elevated Sit to supine: Mod assist;+2 for safety/equipment General bed mobility comments: assist for LEs onto bed, pt does well with scooting hips and moving upper body to reposition Transfers Overall transfer level: Needs assistance Equipment used: Rolling walker (2 wheeled) Transfers: Sit to/from [employer] Transfers Sit to Stand: +2 physical assistance;Max assist;From elevated surface Stand pivot transfers: +2 physical assistance;Mod assist General  transfer comment: verbal cues for technique, able to rise on 2nd attempt this visit, increased assist to rise and steady, pt did better with assisting weight over BOS      ADL:    Cognition: Cognition Overall Cognitive Status: Within Functional Limits for tasks assessed Orientation Level: Oriented X4 Cognition Arousal/Alertness: Awake/alert Behavior During Therapy: WFL for tasks assessed/performed Overall Cognitive Status: Within Functional Limits for tasks assessed  Blood pressure 108/65, pulse 68, temperature 98.3 F (36.8 C), temperature source Oral, resp. rate 16, height 5' 10.5" (1.791 m), weight 131.999 kg (291 lb 0.1 oz), SpO2 92.00%. Physical Exam  Constitutional: She is oriented to person, place, and time.  HENT:  Head: Normocephalic.  Eyes: EOM are normal.  Neck: Normal range of motion. Neck supple. No thyromegaly present.  Cardiovascular: Normal rate and regular rhythm.   Respiratory: Effort normal and breath sounds normal. No respiratory distress.  GI: Soft. Bowel sounds are normal. She exhibits no distension.  Obese  Neurological: She is alert and oriented to person, place, and time.  Skin:  Bilateral knee incisions are dressed and appropriately tender    Results for orders placed during the hospital encounter of 08/02/13 (from the past 24 hour(s))  CBC     Status: Abnormal   Collection Time    08/04/13  4:56 AM      Result Value Ref Range   WBC 10.2  4.0 - 10.5 K/uL   RBC 3.46 (*) 3.87 - 5.11 MIL/uL   Hemoglobin 10.3 (*) 12.0 - 15.0 g/dL   HCT 32.0 (*) 36.0 - 46.0 %   MCV 92.5  78.0 - 100.0 fL   MCH 29.8  26.0 - 34.0 pg   MCHC 32.2  30.0 - 36.0 g/dL   RDW 13.9  11.5 - 15.5 %   Platelets 215  150 - 400 K/uL   No results found.  Assessment/Plan: Diagnosis: endstage OA s/p bilateral TKA's 1. Does the need for close, 24 hr/day medical supervision in concert with the patient's rehab needs make it unreasonable for this patient to be served in a less  intensive setting? Yes 2. Co-Morbidities requiring supervision/potential complications: morbid obesity, abla, htn 3. Due to bladder management, bowel management, safety, skin/wound care, disease management, medication administration, pain management and patient education, does the patient require 24 hr/day rehab nursing? Yes 4. Does the patient require coordinated care of a physician, rehab nurse, PT (1-2 hrs/day, 5 days/week) and OT (1-2 hrs/day, 5 days/week) to address physical and functional deficits in the context of the above medical diagnosis(es)? Yes Addressing deficits in the following areas: balance, endurance, locomotion, strength, transferring, bowel/bladder control, bathing, dressing, feeding, grooming and toileting 5. Can the patient actively participate in an intensive therapy program of at least 3 hrs of therapy per day at least 5 days per week? Yes 6. The potential for patient to make measurable gains while on inpatient rehab is excellent 7. Anticipated functional outcomes upon discharge from inpatient rehab are modified independent  with PT, modified independent with OT, n/a  with SLP. 8. Estimated rehab length of stay to reach the above functional goals is: 10-14 days 9. Does the patient have adequate social supports to accommodate these discharge functional goals? Yes 10. Anticipated D/C setting: Home 11. Anticipated post D/C treatments: HH therapy and Outpatient therapy 12. Overall Rehab/Functional Prognosis: excellent  RECOMMENDATIONS: This patient's condition is appropriate for continued rehabilitative care in the following setting: CIR Patient has agreed to participate in recommended program. Yes Note that insurance prior authorization may be required for reimbursement for recommended care.  Comment: Rehab Admissions Coordinator to follow up.  Thanks,  Meredith Staggers, MD, Mellody Drown     08/04/2013

## 2013-08-04 NOTE — Progress Notes (Signed)
Physical Therapy Treatment Patient Details Name: [name] MRN: [id number] DOB: [birth year] Today's Date: 08/04/2013    History of Present Illness Pt is a 43 year old female s/p bilateral TKRs with hx of morbid obesity.    PT Comments    Pt reports feeling better today and motivated for mobility.  Pt was dizzy upon standing initially so returned to sitting to resolve.  Pt assisted to standing again however dizziness returned so pivoted to recliner.  KI applied to R LE for attempting to increase mobility safely and pt prefers using KI when OOB.   Follow Up Recommendations  CIR     Equipment Recommendations  Rolling walker with 5" wheels (wide)    Recommendations for Other Services       Precautions / Restrictions Precautions Precautions: Fall;Knee Restrictions Other Position/Activity Restrictions: WBAT    Mobility  Bed Mobility Overal bed mobility: Needs Assistance Bed Mobility: Supine to Sit     Supine to sit: Mod assist;HOB elevated     General bed mobility comments: assist for LEs, pt does well with scooting hips and assisting with upper body  Transfers Overall transfer level: Needs assistance Equipment used: Rolling walker (2 wheeled) Transfers: Sit to/from Stand Sit to Stand: +2 physical assistance;Max assist;From elevated surface Stand pivot transfers: +2 physical assistance;Min assist       General transfer comment: verbal cues for technique, standing x2 due to dizziness upon initial stand (sat back down on bed) increased assist to rise and steady, pt did better with assisting weight over BOS, applied KI to R LE (more painful) to assist with mobility, deferred gait due to dizziness not resolving   Ambulation/Gait                 Stairs            Wheelchair Mobility    Modified Rankin (Stroke Patients Only)       Balance                                    Cognition Arousal/Alertness: Awake/alert Behavior During  Therapy: WFL for tasks assessed/performed Overall Cognitive Status: Within Functional Limits for tasks assessed                      Exercises Total Joint Exercises Ankle Circles/Pumps: AROM;Both;10 reps Quad Sets: AROM;Both;10 reps Gluteal Sets: AROM;Both;10 reps Hip ABduction/ADduction: Both;10 reps;AAROM    General Comments        Pertinent Vitals/Pain Activity to tolerance, nsg student to bring more pain meds upon sitting in recliner, ice packs applied    Home Living                      Prior Function            PT Goals (current goals can now be found in the care plan section) Progress towards PT goals: Progressing toward goals    Frequency  7X/week    PT Plan Current plan remains appropriate    Co-evaluation             End of Session Equipment Utilized During Treatment: Gait belt;Right knee immobilizer Activity Tolerance: Patient limited by pain (dizziness) Patient left: in chair;with call bell/phone within reach;with family/visitor present     Time: 1136-1200 PT Time Calculation (min): 24 min  Charges:  $Therapeutic Activity: 23-37 mins  G CodesJunius Argyle 08/04/2013, 2:19 PM Carmelia Bake, PT, DPT 08/04/2013 Pager: [phone]

## 2013-08-04 NOTE — Evaluation (Signed)
Occupational Therapy Evaluation Patient Details Name: [name] MRN: [id number] DOB: 05-25-1970 Today's Date: 08/04/2013    History of Present Illness Pt is a 43 year old female s/p bilateral TKRs with hx of morbid obesity.   Clinical Impression   Pt was admitted for bil TKAs.  She was independent with adls prior to admission and now needs A x 2 for sit to stand during adls.  Pt needs overall mod A for LB bathing and max A for dressing with A x 2.  She will benefit from skilled OT to increase safety and independence with adls and bathroom transfers.  Pt is very motivated and would be a great CIR candidate.    Follow Up Recommendations  CIR    Equipment Recommendations  3 in 1 bedside comode    Recommendations for Other Services       Precautions / Restrictions Precautions Precautions: Fall;Knee Restrictions Other Position/Activity Restrictions: WBAT      Mobility Bed Mobility              Transfers Overall transfer level: Needs assistance Equipment used: Rolling walker (2 wheeled) Transfers: Sit to/from Stand Sit to Stand: +2 physical assistance;Mod assist Stand pivot transfers: +2 physical assistance;Min assist       General transfer comment: cues for LE position and weight shifting forward Had difficulty slowly walking legs forward with stand to sit--tended to sit quickly and let legs slide forward   Balance                                            ADL Overall ADL's : Needs assistance/impaired     Grooming: Set up;Sitting   Upper Body Bathing: Set up;Sitting   Lower Body Bathing: +2 for physical assistance;Moderate assistance;Sit to/from stand   Upper Body Dressing : Minimal assistance;Sitting (due to lines)   Lower Body Dressing: +2 for physical assistance;Maximal assistance;Sit to/from stand   Toilet Transfer: +2 for physical assistance;Moderate assistance;RW   Toileting- Clothing Manipulation and Hygiene: +2 for  physical assistance;Maximal assistance;Sit to/from stand       Functional mobility during ADLs: +2 for physical assistance;Moderate assistance;Rolling walker General ADL Comments: performed adl chair level then returned with Physical Therapy later to assist with peri areas.  Pt has difficulty weight shifting forward in standing and needs cues/physical assistance.  Also needs cues for walker distance as she tends to keep body too close to front of RW.  Pt is able to reach to feet to start socks but needs assistance with socks,shoes and to pull pants over hips.  Educated on AE--pt may potentially benefit from sock aid later  Showed pt photo of tub transfer bench, if she wants to consider this for home:  Will defer further education on this to rehab.       Vision                     Perception     Praxis      Pertinent Vitals/Pain bil knees painful but pt did not rate pain.  Left pt with PT; RN going to bring medication     Hand Dominance     Extremity/Trunk Assessment Upper Extremity Assessment Upper Extremity Assessment: Overall WFL for tasks assessed           Communication Communication Communication: No difficulties   Cognition Arousal/Alertness: Awake/alert Behavior During Therapy:  WFL for tasks assessed/performed Overall Cognitive Status: Within Functional Limits for tasks assessed                     General Comments       Exercises       Shoulder Instructions      Home Living Family/patient expects to be discharged to:: Inpatient rehab Living Arrangements: Spouse/significant other;Children                           Home Equipment: [place] - single point   Additional Comments: pt has a tub/shower      Prior Functioning/Environment Level of Independence: Independent             OT Diagnosis: Generalized weakness   OT Problem List: Decreased strength;Decreased activity tolerance;Decreased knowledge of use of DME or AE;Pain    OT Treatment/Interventions: Self-care/ADL training;DME and/or AE instruction;Patient/family education    OT Goals(Current goals can be found in the care plan section) Acute Rehab OT Goals Patient Stated Goal: get back to being independent OT Goal Formulation: With patient Time For Goal Achievement: 08/11/13 Potential to Achieve Goals: Good ADL Goals Pt Will Perform Grooming: standing;with min guard assist Pt Will Transfer to Toilet: with min assist;bedside commode;ambulating Pt Will Perform Toileting - Clothing Manipulation and hygiene: with min assist;sit to/from stand  OT Frequency: Min 2X/week   Barriers to D/C:            Co-evaluation              End of Session    Activity Tolerance: Patient tolerated treatment well Patient left: in chair (end of first session; left with PT later session)   Time: 8341-9622 and 1454-j1504 with PT OT Time Calculation (min): 18 min (plus 10 minutes with PT--not charged) Charges:  OT General Charges $OT Visit: 1 Procedure OT Evaluation $Initial OT Evaluation Tier I: 1 Procedure OT Treatments $Self Care/Home Management : 8-22 mins G-Codes:    Lesle Chris 08/06/2013, 3:34 PM Lesle Chris, OTR/L [phone] 08-06-13

## 2013-08-04 NOTE — Discharge Instructions (Signed)
Information on my medicine - XARELTO (Rivaroxaban)  This medication education was reviewed with me or my healthcare representative as part of my discharge preparation.  The pharmacist that spoke with me during my hospital stay was:  Julio Sicks, [facility]  Why was Xarelto prescribed for you? Xarelto was prescribed for you to reduce the risk of blood clots forming after orthopedic surgery. The medical term for these abnormal blood clots is venous thromboembolism (VTE).  What do you need to know about xarelto ? Take your Xarelto ONCE DAILY at the same time every day. You may take it either with or without food.  If you have difficulty swallowing the tablet whole, you may crush it and mix in applesauce just prior to taking your dose.  Take Xarelto exactly as prescribed by your doctor and DO NOT stop taking Xarelto without talking to the doctor who prescribed the medication.  Stopping without other VTE prevention medication to take the place of Xarelto may increase your risk of developing a clot.  After discharge, you should have regular check-up appointments with your healthcare provider that is prescribing your Xarelto.    What do you do if you miss a dose? If you miss a dose, take it as soon as you remember on the same day then continue your regularly scheduled once daily regimen the next day. Do not take two doses of Xarelto on the same day.   Important Safety Information A possible side effect of Xarelto is bleeding. You should call your healthcare provider right away if you experience any of the following:   Bleeding from an injury or your nose that does not stop.   Unusual colored urine (red or dark brown) or unusual colored stools (red or black).   Unusual bruising for unknown reasons.   A serious fall or if you hit your head (even if there is no bleeding).  Some medicines may interact with Xarelto and might increase your risk of bleeding while on Xarelto. To help avoid this,  consult your healthcare provider or pharmacist prior to using any new prescription or non-prescription medications, including herbals, vitamins, non-steroidal anti-inflammatory drugs (NSAIDs) and supplements.  This website has more information on Xarelto: [email or web address]

## 2013-08-04 NOTE — Progress Notes (Signed)
   Subjective: 2 Days Post-Op Procedure(s) (LRB): TOTAL KNEE BILATERAL (Bilateral)   Patient reports pain as mild, pain controlled. No events throughout the night. Denies any CP or SOB.  States that the pain medication is helping but makes her a little sleepy.  Objective:   VITALS:   Filed Vitals:   08/04/13 0627  BP: 102/65  Pulse: 65  Temp: 98.6 F (37 C)  Resp: 17    Neurovascular intact Dorsiflexion/Plantar flexion intact Incision: dressing C/D/I No cellulitis present Compartment soft  LABS  Recent Labs  08/03/13 0435 08/04/13 0456  HGB 10.9* 10.3*  HCT 31.6* 32.0*  WBC 11.5* 10.2  PLT 238 215     Recent Labs  08/03/13 0435 08/04/13 0456  NA 138 138  K 3.9 4.1  BUN 10 11  CREATININE 0.50 0.61  GLUCOSE 138* 118*     Assessment/Plan: 2 Days Post-Op Procedure(s) (LRB): TOTAL KNEE BILATERAL (Bilateral) Up with therapy Discharge to SNF/CIR eventually, when ready  Expected ABLA  Treated with iron and will observe  Morbid Obesity (BMI >40)  Estimated body mass index is 41.15 kg/(m^2) as calculated from the following:   Height as of this encounter: 5' 10.5" (1.791 m).   Weight as of this encounter: 131.999 kg (291 lb 0.1 oz). Patient also counseled that weight may inhibit the healing process Patient counseled that losing weight will help with future health issues        [place] Pugh. [place]   PAC  08/04/2013, 8:20 AM

## 2013-08-04 NOTE — Progress Notes (Signed)
Physical Therapy Treatment Note   08/04/13 1535  PT Visit Information  Last PT Received On 08/04/13  Assistance Needed +2  History of Present Illness Pt is a 44 year old female s/p bilateral TKRs with hx of morbid obesity.  PT Time Calculation  PT Start Time 7209  PT Stop Time 1518  PT Time Calculation (min) 24 min  Subjective Data  Subjective Pt agreeable to attempt ambulation this afternoon and reports dizziness better upon standing.  Pt ambulated around room and then assisted back to bed  Precautions  Precautions Fall;Knee  Restrictions  Other Position/Activity Restrictions WBAT  Cognition  Arousal/Alertness Awake/alert  Behavior During Therapy WFL for tasks assessed/performed  Overall Cognitive Status Within Functional Limits for tasks assessed  Bed Mobility  Overal bed mobility Needs Assistance  Bed Mobility Sit to Supine  Sit to supine Mod assist  General bed mobility comments assist for bil LEs  Transfers  Overall transfer level Needs assistance  Equipment used Rolling walker (2 wheeled)  Transfers Sit to/from Stand  Sit to Stand +2 physical assistance;Mod assist  General transfer comment verbal cues for UE and LE placement, correction to previous note as KI used on L LE has this is the LE which is more painful, cues for walking feet and weight shifting  Ambulation/Gait  Ambulation/Gait assistance +2 physical assistance;+2 safety/equipment;Mod assist  Ambulation Distance (Feet) 11 Feet  Assistive device Rolling walker (2 wheeled)  Gait Pattern/deviations Step-to pattern  General Gait Details increased effort and lateral lean to advance LEs, difficulty keeping weight under BOS with cues for RW distance (tends to keep to close) and maintaing weight over BOS, +2 for assist and then another person following with recliner for safety  Total Joint Exercises  Short Arc Doretha Imus;Both;10 reps  Heel Slides AAROM;Both;10 reps  PT - End of Session  Equipment Utilized During  Treatment Gait belt;Left knee immobilizer  Activity Tolerance Patient tolerated treatment well  Patient left in bed;with call bell/phone within reach;with nursing/sitter in room  PT - Assessment/Plan  PT Plan Current plan remains appropriate  PT Frequency 7X/week  Follow Up Recommendations CIR  PT equipment Rolling walker with 5" wheels (wide)  PT Goal Progression  Progress towards PT goals Progressing toward goals  PT General Charges  $$ ACUTE PT VISIT 1 Procedure  PT Treatments  $Gait Training 23-37 mins   Carmelia Bake, PT, DPT 08/04/2013 Pager: [phone]

## 2013-08-04 NOTE — Care Management Note (Signed)
    Page 1 of 1   08/04/2013     8:52:30 AM CARE MANAGEMENT NOTE 08/04/2013  Patient:  [name]   Account Number:  [id number]  Date Initiated:  08/03/2013  Documentation initiated by:  [facility]  Subjective/Objective Assessment:   adm: Bilateral O/A knee pain; TOTAL KNEE BILATERAL     Action/Plan:   discharge planning/CIR   Anticipated DC Date:  08/05/2013   Anticipated DC Plan:  IP REHAB FACILITY      DC Planning Services  CM consult      [facility] Choice  HOME HEALTH   Choice offered to / List presented to:  C-1 Patient           Status of service:  Completed, signed off Medicare Important Message given?   (If response is "NO", the following Medicare IM given date fields will be blank) Date Medicare IM given:   Date Additional Medicare IM given:    Discharge Disposition:  IP REHAB FACILITY  Per UR Regulation:    If discussed at Long Length of Stay Meetings, dates discussed:    Comments:  08/03/13 15:00 Call made to CIR and spoke to [facility] who states she will eval 5/20. No other CM needs were communicated.  Mariane Masters, BSN, [place] [phone]  11:00 Though CIR/SNF recc., pt is still wishing to be set up for home health.  Pt wishes to have [facility] render HHPT/aide serivices.  Address and contact information verified with pt.  3n1 and rolling walker will be delivered to room prior to discharge if [facility] is disposition.Tentative Referral made to [facility] rep, on unit.  CM will continue to monitor for disposition.  Mariane Masters, BSN, CM [phone].

## 2013-08-05 ENCOUNTER — Inpatient Hospital Stay (HOSPITAL_COMMUNITY)
Admission: RE | Admit: 2013-08-05 | Discharge: 2013-08-13 | DRG: 945 | Disposition: A | Discharge status: Home Health | Payer: BC Managed Care – PPO | Source: Intra-hospital | Attending: Physical Medicine & Rehabilitation | Admitting: Physical Medicine & Rehabilitation

## 2013-08-05 DIAGNOSIS — Z96653 Presence of artificial knee joint, bilateral: Secondary | ICD-10-CM

## 2013-08-05 DIAGNOSIS — K5900 Constipation, unspecified: Secondary | ICD-10-CM | POA: Diagnosis present

## 2013-08-05 DIAGNOSIS — A498 Other bacterial infections of unspecified site: Secondary | ICD-10-CM | POA: Diagnosis not present

## 2013-08-05 DIAGNOSIS — Z833 Family history of diabetes mellitus: Secondary | ICD-10-CM

## 2013-08-05 DIAGNOSIS — Z96659 Presence of unspecified artificial knee joint: Secondary | ICD-10-CM

## 2013-08-05 DIAGNOSIS — K219 Gastro-esophageal reflux disease without esophagitis: Secondary | ICD-10-CM | POA: Diagnosis present

## 2013-08-05 DIAGNOSIS — Z5189 Encounter for other specified aftercare: Principal | ICD-10-CM

## 2013-08-05 DIAGNOSIS — D62 Acute posthemorrhagic anemia: Secondary | ICD-10-CM | POA: Diagnosis present

## 2013-08-05 DIAGNOSIS — I10 Essential (primary) hypertension: Secondary | ICD-10-CM | POA: Diagnosis present

## 2013-08-05 DIAGNOSIS — Z7901 Long term (current) use of anticoagulants: Secondary | ICD-10-CM

## 2013-08-05 DIAGNOSIS — Z6841 Body Mass Index (BMI) 40.0 and over, adult: Secondary | ICD-10-CM

## 2013-08-05 DIAGNOSIS — G4730 Sleep apnea, unspecified: Secondary | ICD-10-CM | POA: Diagnosis present

## 2013-08-05 DIAGNOSIS — N390 Urinary tract infection, site not specified: Secondary | ICD-10-CM | POA: Diagnosis not present

## 2013-08-05 DIAGNOSIS — Z882 Allergy status to sulfonamides status: Secondary | ICD-10-CM

## 2013-08-05 DIAGNOSIS — Z800 Family history of malignant neoplasm of digestive organs: Secondary | ICD-10-CM

## 2013-08-05 DIAGNOSIS — Z9089 Acquired absence of other organs: Secondary | ICD-10-CM

## 2013-08-05 DIAGNOSIS — Z79899 Other long term (current) drug therapy: Secondary | ICD-10-CM

## 2013-08-05 DIAGNOSIS — M1710 Unilateral primary osteoarthritis, unspecified knee: Secondary | ICD-10-CM

## 2013-08-05 DIAGNOSIS — Z9851 Tubal ligation status: Secondary | ICD-10-CM

## 2013-08-05 DIAGNOSIS — Z803 Family history of malignant neoplasm of breast: Secondary | ICD-10-CM

## 2013-08-05 DIAGNOSIS — Z880 Allergy status to penicillin: Secondary | ICD-10-CM

## 2013-08-05 MED ORDER — POLYETHYLENE GLYCOL 3350 17 G PO PACK: 17.0000 g | PACK | Freq: Two times a day (BID) | ORAL | Status: AC

## 2013-08-05 MED ORDER — RIVAROXABAN 10 MG PO TABS
10.0000 mg | ORAL_TABLET | Freq: Every day | ORAL | Status: DC
  Administered 2013-08-06 – 2013-08-13 (×8): 10 mg via ORAL
  Filled 2013-08-05 (×9): qty 1

## 2013-08-05 MED ORDER — FLUTICASONE PROPIONATE 50 MCG/ACT NA SUSP
1.0000 | Freq: Every day | NASAL | Status: DC
  Filled 2013-08-05: qty 16

## 2013-08-05 MED ORDER — ONDANSETRON HCL 4 MG PO TABS: 4.0000 mg | ORAL_TABLET | Freq: Four times a day (QID) | ORAL | Status: DC | PRN

## 2013-08-05 MED ORDER — OXYCODONE HCL 5 MG PO TABS
5.0000 mg | ORAL_TABLET | ORAL | Status: DC
  Administered 2013-08-05 (×2): 20 mg via ORAL
  Filled 2013-08-05 (×2): qty 4

## 2013-08-05 MED ORDER — LORATADINE 10 MG PO TABS
10.0000 mg | ORAL_TABLET | Freq: Every day | ORAL | Status: DC
  Administered 2013-08-13: 10 mg via ORAL
  Filled 2013-08-05 (×9): qty 1

## 2013-08-05 MED ORDER — METHOCARBAMOL 500 MG PO TABS: 500.0000 mg | ORAL_TABLET | Freq: Four times a day (QID) | ORAL | Status: DC | PRN

## 2013-08-05 MED ORDER — DOCUSATE SODIUM 100 MG PO CAPS
100.0000 mg | ORAL_CAPSULE | Freq: Two times a day (BID) | ORAL | Status: DC
  Administered 2013-08-05 – 2013-08-13 (×15): 100 mg via ORAL
  Filled 2013-08-05 (×18): qty 1

## 2013-08-05 MED ORDER — ALUM & MAG HYDROXIDE-SIMETH 200-200-20 MG/5ML PO SUSP: 30.0000 mL | ORAL | Status: DC | PRN

## 2013-08-05 MED ORDER — SORBITOL 70 % SOLN: 30.0000 mL | Freq: Every day | Status: DC | PRN

## 2013-08-05 MED ORDER — OXYCODONE HCL 5 MG PO TABS
5.0000 mg | ORAL_TABLET | ORAL | Status: DC
  Administered 2013-08-05 (×2): 20 mg via ORAL
  Administered 2013-08-06: 10 mg via ORAL
  Administered 2013-08-06: 20 mg via ORAL
  Administered 2013-08-06: 10 mg via ORAL
  Administered 2013-08-06: 20 mg via ORAL
  Administered 2013-08-06: 10 mg via ORAL
  Administered 2013-08-06: 20 mg via ORAL
  Administered 2013-08-06: 10 mg via ORAL
  Administered 2013-08-06: 20 mg via ORAL
  Filled 2013-08-05: qty 1
  Filled 2013-08-05: qty 3
  Filled 2013-08-05 (×2): qty 4
  Filled 2013-08-05 (×4): qty 2
  Filled 2013-08-05 (×3): qty 4

## 2013-08-05 MED ORDER — ASPIRIN EC 325 MG PO TBEC: 325.0000 mg | DELAYED_RELEASE_TABLET | Freq: Two times a day (BID) | ORAL | Status: DC

## 2013-08-05 MED ORDER — RIVAROXABAN 10 MG PO TABS: 10.0000 mg | ORAL_TABLET | Freq: Every day | ORAL | Status: DC

## 2013-08-05 MED ORDER — PANTOPRAZOLE SODIUM 20 MG PO TBEC
20.0000 mg | DELAYED_RELEASE_TABLET | Freq: Every day | ORAL | Status: DC
  Administered 2013-08-06 – 2013-08-13 (×8): 20 mg via ORAL
  Filled 2013-08-05 (×9): qty 1

## 2013-08-05 MED ORDER — ONDANSETRON HCL 4 MG/2ML IJ SOLN: 4.0000 mg | Freq: Four times a day (QID) | INTRAMUSCULAR | Status: DC | PRN

## 2013-08-05 MED ORDER — BISACODYL 10 MG RE SUPP: 10.0000 mg | Freq: Every day | RECTAL | Status: DC | PRN

## 2013-08-05 MED ORDER — FERROUS SULFATE 325 (65 FE) MG PO TABS: 325.0000 mg | ORAL_TABLET | Freq: Three times a day (TID) | ORAL | Status: DC

## 2013-08-05 MED ORDER — ACETAMINOPHEN 325 MG PO TABS
325.0000 mg | ORAL_TABLET | ORAL | Status: DC | PRN
  Filled 2013-08-05: qty 2

## 2013-08-05 MED ORDER — POLYETHYLENE GLYCOL 3350 17 G PO PACK
17.0000 g | PACK | Freq: Two times a day (BID) | ORAL | Status: DC
  Administered 2013-08-05 – 2013-08-13 (×16): 17 g via ORAL
  Filled 2013-08-05 (×18): qty 1

## 2013-08-05 MED ORDER — OXYCODONE HCL 5 MG PO TABS: 5.0000 mg | ORAL_TABLET | ORAL | Status: DC

## 2013-08-05 MED ORDER — FERROUS SULFATE 325 (65 FE) MG PO TABS
325.0000 mg | ORAL_TABLET | Freq: Three times a day (TID) | ORAL | Status: DC
  Administered 2013-08-05 – 2013-08-13 (×24): 325 mg via ORAL
  Filled 2013-08-05 (×28): qty 1

## 2013-08-05 MED ORDER — DSS 100 MG PO CAPS: 100.0000 mg | ORAL_CAPSULE | Freq: Two times a day (BID) | ORAL | Status: AC

## 2013-08-05 NOTE — Progress Notes (Signed)
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 Date: 03/18/13 Deduct: $700 (met all) Out of Pocket Max: $3210 (met $258.63) Life Max: none  CIR: $233 copay per admit then 80% SNF: $233 copay and then 80% with 100 days max  Outpatient: 80% with no limits Co-Pay: 20%  Home Health: 80% with 60 visits max Co-Pay: 20%  DME: 80% Co-Pay: 20%  Providers: in Brewing technologist Information    Name  Relation  Home  Work  [place]  Spouse  [phone]   [phone]      Current Medical History  Patient Admitting Diagnosis: B TKR  History of Present Illness: A 43 y.o. right-handed female history of sleep apnea, and morbid obesity admitted 08/02/2013 with progressive x2-3 years bilateral knee pain secondary to end-stage osteoarthritis and no relief with conservative care including NSAIDs, corticosteroid injections and activity modification. 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 Underwent bilateral total knee replacement 08/02/2013 per Dr. Alvan Dame. Postoperative pain management. Weightbearing as tolerated bilateral lower extremities. Placed on Xarelto for DVT prophylaxis. Acute blood loss anemia 10.3 and monitored. Physical therapy and occupational evaluations completed 08/04/2011 with recommendations for physical medicine rehabilitation consult. Patient to be admitted for comprehensive  inpatient rehabilitation program.  Past Medical History  Past Medical History   Diagnosis  Date   .  Arthritis    .  Sleep apnea    .  Obesity, Class III, BMI 40-49.9 (morbid obesity)    .  Heart murmur    .  Pneumonia      hx of   .  Enlarged thyroid      "all tests are normal"   .  GERD (gastroesophageal reflux disease)      with motrin   .  Adrenal tumor      "monitoring"   .  Anemia      hx of    Family History  family history includes Breast cancer in an other family member; Cancer in her mother; Diabetes in her mother; Irritable bowel syndrome in her sister; Pancreatic cancer in an other family member.  Prior Rehab/Hospitalizations: Had back injury about 6 yrs ago and had a few sessions of outpatient therapy.  Current Medications  Current facility-administered medications:alum & mag hydroxide-simeth (MAALOX/MYLANTA) 200-200-20 MG/5ML suspension 30 mL, 30 mL, Oral, Q4H PRN, Lucille Passy Babish, PA-C; bisacodyl (DULCOLAX) suppository 10 mg, 10 mg, Rectal, Daily PRN, Lucille Passy Babish, PA-C; dexamethasone (DECADRON) injection 10 mg, 10 mg, Intravenous, Once, Lucille Passy Babish, PA-C  diphenhydrAMINE (BENADRYL) capsule 25 mg, 25 mg, Oral, Q6H PRN, Lucille Passy Babish, PA-C; docusate sodium (COLACE) capsule 100 mg, 100 mg, Oral, BID, Lucille Passy Babish, PA-C, 100 mg at 08/05/13 4174; ferrous sulfate tablet 325 mg, 325 mg, Oral, TID PC, Lucille Passy Babish, PA-C, 325 mg at 08/05/13 0902; fluticasone (FLONASE) 50 MCG/ACT nasal spray 1 spray, 1 spray, Each Nare, Daily, Lucille Passy Babish, PA-C  HYDROmorphone (  DILAUDID) injection 0.5-2 mg, 0.5-2 mg, Intravenous, Q2H PRN, Lucille Passy Babish, PA-C, 1 mg at 08/05/13 0019; ketorolac (TORADOL) 15 MG/ML injection 7.5 mg, 7.5 mg, Intravenous, 4 times per day, Mauri Pole, MD, 7.5 mg at 08/05/13 1151; loratadine (CLARITIN) tablet 10 mg, 10 mg, Oral, Daily, Lucille Passy Babish, PA-C; menthol-cetylpyridinium (CEPACOL) lozenge 3 mg, 1  lozenge, Oral, PRN, Lucille Passy Babish, PA-C  methocarbamol (ROBAXIN) 500 mg in dextrose 5 % 50 mL IVPB, 500 mg, Intravenous, Q6H PRN, Lucille Passy Babish, PA-C; methocarbamol (ROBAXIN) tablet 500 mg, 500 mg, Oral, Q6H PRN, Lucille Passy Babish, PA-C, 500 mg at 08/05/13 0306; metoCLOPramide (REGLAN) injection 5-10 mg, 5-10 mg, Intravenous, Q8H PRN, Lucille Passy Babish, PA-C, 10 mg at 08/03/13 0944; metoCLOPramide (REGLAN) tablet 5-10 mg, 5-10 mg, Oral, Q8H PRN, Lucille Passy Babish, PA-C  ondansetron [facility]) injection 4 mg, 4 mg, Intravenous, Q6H PRN, Lucille Passy Babish, PA-C, 4 mg at 08/03/13 3614; ondansetron [facility]) tablet 4 mg, 4 mg, Oral, Q6H PRN, Lucille Passy Babish, PA-C; oxyCODONE (Oxy IR/ROXICODONE) immediate release tablet 5-20 mg, 5-20 mg, Oral, Q3H, Lucille Passy Babish, PA-C, 20 mg at 08/05/13 1151; pantoprazole (PROTONIX) EC tablet 20 mg, 20 mg, Oral, Daily, Lucille Passy Babish, PA-C, 20 mg at 08/05/13 0902  phenol (CHLORASEPTIC) mouth spray 1 spray, 1 spray, Mouth/Throat, PRN, Lucille Passy Babish, PA-C; polyethylene glycol (MIRALAX / GLYCOLAX) packet 17 g, 17 g, Oral, BID, Lucille Passy Babish, PA-C, 17 g at 08/05/13 0901; promethazine (PHENERGAN) injection 6.25-12.5 mg, 6.25-12.5 mg, Intravenous, Q6H PRN, Lucille Passy Babish, PA-C, 12.5 mg at 08/03/13 1753  rivaroxaban (XARELTO) tablet 10 mg, 10 mg, Oral, Daily, Lucille Passy Babish, PA-C, 10 mg at 08/05/13 4315; sodium chloride 0.9 % 1,000 mL with potassium chloride 10 mEq infusion, , Intravenous, Continuous, Lucille Passy Babish, PA-C, Last Rate: 20 mL/hr at 08/04/13 0132; zolpidem (AMBIEN) tablet 5 mg, 5 mg, Oral, QHS PRN, Lucille Passy Babish, PA-C  Patients Current Diet: General  Precautions / Restrictions  Precautions  Precautions: Fall;Knee  Restrictions  Weight Bearing Restrictions: No  Other Position/Activity Restrictions: WBAT  Prior Activity Level  Community (5-7x/wk): Worked FT as a Pharmacist, 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  Home  Assistive Devices / Equipment  Home Assistive Devices/Equipment: Eyeglasses;Cane (specify quad or straight)  Home Equipment: Cane - single point  Prior Functional Level  Prior Function  Level of Independence: Independent  Current Functional Level  Cognition  Overall Cognitive Status: Within Functional Limits for tasks assessed  Orientation Level: Oriented X4   Extremity Assessment  (includes Sensation/Coordination)  Lower Extremity Assessment: RLE deficits/detail;LLE deficits/detail  RLE Deficits / Details: reports full sensation bilaterally, functional AAROM sitting EOB knee flexion 40*  LLE Deficits / Details: reports full sensation bilaterally, functional AAROM sitting EOB knee flexion 40*   ADLs  Overall ADL's : Needs assistance/impaired  Grooming: Set up;Sitting  Upper Body Bathing: Set up;Sitting  Lower Body Bathing: +2 for physical assistance;Moderate assistance;Sit to/from stand  Upper Body Dressing : Minimal assistance;Sitting (due to lines)  Lower Body Dressing: +2 for physical assistance;Maximal assistance;Sit to/from stand  Toilet Transfer: +2 for physical assistance;Moderate assistance;RW  Toileting- Clothing Manipulation and Hygiene: +2 for physical assistance;Maximal assistance;Sit to/from stand  Functional mobility during ADLs: +2 for physical assistance;Moderate assistance;Rolling walker  General ADL Comments: performed adl chair level then returned with Physical Therapy later to assist with peri areas. Pt has difficulty weight shifting forward in standing and needs cues/physical assistance. Also needs cues for walker distance as she tends to keep body too close  to front of RW. Pt is able to reach to feet to start socks but needs assistance with socks,shoes and to pull pants over hips. Educated on AE--pt may potentially benefit from sock aid later   Mobility  Overal bed mobility: Needs Assistance  Bed Mobility: Sit to Supine  Supine to sit: Mod assist;HOB elevated  Sit to  supine: Mod assist  General bed mobility comments: assist for bil LEs   Transfers  Overall transfer level: Needs assistance  Equipment used: Rolling walker (2 wheeled)  Transfers: Sit to/from Stand  Sit to Stand: +2 physical assistance;Mod assist  Stand pivot transfers: +2 physical assistance;Min assist  General transfer comment: verbal cues for UE and LE placement, correction to previous note as KI used on L LE has this is the LE which is more painful, cues for walking feet and weight shifting   Ambulation / Gait / Stairs / Wheelchair Mobility  Ambulation/Gait  Ambulation/Gait assistance: +2 physical assistance;+2 safety/equipment;Mod assist  Ambulation Distance (Feet): 11 Feet  Assistive device: Rolling walker (2 wheeled)  Gait Pattern/deviations: Step-to pattern  General Gait Details: increased effort and lateral lean to advance LEs, difficulty keeping weight under BOS with cues for RW distance (tends to keep to close) and maintaing weight over BOS, +2 for assist and then another person following with recliner for safety   Posture / Balance    Special needs/care consideration  BiPAP/CPAP Yes, has CPAP at home and in hospital  CPM No  Continuous Drip IV No  Dialysis No  Life Vest No  Oxygen No  Special Bed No  Trach Size No  Wound Vac (area) No  Skin B TKR incisions  Bowel mgmt: No BM since admission. Last BM 08/01/13  Bladder mgmt: Voiding up on BSC  Diabetic mgmt No   Previous Home Environment  Living Arrangements: Spouse/significant other;Children  Home Care Services: No  Additional Comments: pt has a tub/shower  Discharge Living Setting  Plans for Discharge Living Setting: Patient's home;House;Lives with (comment) (Lives with husband and 25 yo son.)  Type of Home at Discharge: House  Discharge Home Layout: Two level;Able to live on main level with bedroom/bathroom  Alternate Level Stairs-Number of Steps: Flight  Discharge Home Access: Stairs to enter  [employer] of Steps: 4 steps front entry, 23 steps back deck entry.  Does the patient have any problems obtaining your medications?: No  Social/Family/Support Systems  Patient Roles: Spouse;Parent (Has a husband and 3 children ages 85, 33, and 74)  Contact Information: Boston Catarino - spouse  Anticipated Caregiver: Husband and children  Anticipated Caregiver's Contact Information: Herbie Baltimore - spouse [phone]  Ability/Limitations of Caregiver: Husband works FT as a Pharmacist, hospital. Children all work. Spouse and children will take turns sharing caregiver responsibilities  Caregiver Availability: Intermittent  Discharge Plan Discussed with Primary Caregiver: Yes  Is Caregiver In Agreement with Plan?: Yes  Does Caregiver/Family have Issues with Lodging/Transportation while Pt is in Rehab?: No  Goals/Additional Needs  Patient/Family Goal for Rehab: PT/OT mod I goals  Expected length of stay: 10-14 days  Cultural Considerations: None  Dietary Needs: Regular diet, thin liquids  Equipment Needs: TBD  Pt/Family Agrees to Admission and willing to participate: Yes  Program Orientation Provided & Reviewed with Pt/Caregiver Including Roles & Responsibilities: Yes  Decrease burden of Care through IP rehab admission: N/A  Possible need for SNF placement upon discharge: Not planned  Patient Condition: This patient's condition remains as documented in the consult dated 08/05/13, in which the Rehabilitation  Physician determined and documented that the patient's condition is appropriate for intensive rehabilitative care in an inpatient rehabilitation facility. Will admit to inpatient rehab today.  Preadmission Screen Completed By: [name], 08/05/2013 12:52 PM  ______________________________________________________________________  Discussed status with Dr. Letta Pate on 08/05/13 at 1252 and received telephone approval for admission today.  Admission Coordinator: [name], time1253/Date05/21/15   Cosigned by: Charlett Blake, MD [08/05/2013 1:11 PM]   Revision History.Marland KitchenMarland Kitchen

## 2013-08-05 NOTE — Progress Notes (Signed)
Physical Therapy Treatment Note   08/05/13 1400  PT Visit Information  Last PT Received On 08/05/13  Assistance Needed +2  History of Present Illness Pt is a 43 year old female s/p bilateral TKRs with hx of morbid obesity.  PT Time Calculation  PT Start Time 1346  PT Stop Time 1403  PT Time Calculation (min) 17 min  Subjective Data  Subjective Pt ambulated to doorway and back then assisted back to bed.  Pt to d/c to CIR today.  Precautions  Precautions Fall;Knee  Restrictions  Other Position/Activity Restrictions WBAT  Cognition  Arousal/Alertness Awake/alert  Behavior During Therapy WFL for tasks assessed/performed  Overall Cognitive Status Within Functional Limits for tasks assessed  Bed Mobility  Overal bed mobility Needs Assistance  Bed Mobility Sit to Supine  Sit to supine Mod assist  General bed mobility comments assist for bil LEs  Transfers  Overall transfer level Needs assistance  Equipment used Rolling walker (2 wheeled)  Transfers Sit to/from Stand  Sit to Stand +2 physical assistance;Mod assist  General transfer comment verbal cues for UE and LE placement, KI used on L LE, cues for walking feet and weight shifting  Ambulation/Gait  Ambulation/Gait assistance +2 physical assistance;Min assist  Ambulation Distance (Feet) 18 Feet  Assistive device Rolling walker (2 wheeled)  Gait Pattern/deviations Step-to pattern  General Gait Details pt with great improvement on weight shifting appropriately and required less assist this afternoon, able to ambulate to just outside doorway and then back to bed without recliner following, maintained L KI during gait  PT - End of Session  Equipment Utilized During Treatment Gait belt;Left knee immobilizer  Activity Tolerance Patient tolerated treatment well  Patient left in bed;with call bell/phone within reach;with family/visitor present  PT - Assessment/Plan  PT Plan Current plan remains appropriate  PT Frequency 7X/week  Follow  Up Recommendations CIR  PT equipment Rolling walker with 5" wheels  PT Goal Progression  Progress towards PT goals Progressing toward goals  PT General Charges  $$ ACUTE PT VISIT 1 Procedure  PT Treatments  $Gait Training 8-22 mins   Carmelia Bake, PT, DPT 08/05/2013 Pager: [phone]

## 2013-08-05 NOTE — Progress Notes (Signed)
   Subjective: 3 Days Post-Op Procedure(s) (LRB): TOTAL KNEE BILATERAL (Bilateral)   Patient reports pain as mild, pain controlled. No events throughout the night.  States that she is ready to work with PT and get better.  Objective:   VITALS:   Filed Vitals:   08/05/13 0547  BP: 143/76  Pulse: 91  Temp: 98.2 F (36.8 C)  Resp: 18    Neurovascular intact Dorsiflexion/Plantar flexion intact Incision: dressing C/D/I No cellulitis present Compartment soft  LABS  Recent Labs  08/03/13 0435 08/04/13 0456  HGB 10.9* 10.3*  HCT 31.6* 32.0*  WBC 11.5* 10.2  PLT 238 215     Recent Labs  08/03/13 0435 08/04/13 0456  NA 138 138  K 3.9 4.1  BUN 10 11  CREATININE 0.50 0.61  GLUCOSE 138* 118*     Assessment/Plan: 3 Days Post-Op Procedure(s) (LRB): TOTAL KNEE BILATERAL (Bilateral) Up with therapy Discharge to CIR today Follow up in 2 weeks at [facility]. Follow up with OLIN,Fairy Ashlock D in 2 weeks.  Contact information:  [facility] [address] [place] Lynze Reddy   PAC  08/05/2013, 8:45 AM

## 2013-08-05 NOTE — Progress Notes (Signed)
Rehab admissions - I have approval for acute inpatient rehab admission for today.  Bed available and will admit to acute inpatient rehab today.  Call me for questions.  #[phone] 

## 2013-08-05 NOTE — Progress Notes (Signed)
Physical Medicine and Rehabilitation Consult  Reason for Consult: Bilateral total knee replacement  Referring Physician: Dr.:Olin  HPI: [name] is a 43 y.o. right-handed female history of sleep apnea, and morbid obesity admitted 08/02/2013 with progressive x2-3 years bilateral knee pain secondary to end-stage osteoarthritis and no relief with conservative care including NSAIDs, corticosteroid injections and activity modification. 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 Underwent bilateral total knee replacement 08/02/2013 per Dr. Alvan Dame. Postoperative pain management.  Weightbearing as tolerated bilateral lower extremities. Placed on Xarelto for DVT prophylaxis. Acute blood loss anemia 10.3 and monitored. Physical therapy evaluation completed 08/04/2011 with recommendations for physical medicine rehabilitation consult.  Review of Systems  Gastrointestinal: Positive for constipation.  GERD  Musculoskeletal: Positive for joint pain and myalgias.  All other systems reviewed and are negative.   Past Medical History   Diagnosis  Date   .  Arthritis    .  Sleep apnea    .  Obesity, Class III, BMI 40-49.9 (morbid obesity)    .  Heart murmur    .  Pneumonia      hx of   .  Enlarged thyroid      "all tests are normal"   .  GERD (gastroesophageal reflux disease)      with motrin   .  Adrenal tumor      "monitoring"   .  Anemia      hx of    Past Surgical History   Procedure  Laterality  Date   .  Abdominal hysterectomy   03/2003     partial   .  Tubal ligation     .  Lipoma excision   01/11/2010   .  Cholecystectomy   2012   .  Total knee arthroplasty  Bilateral  08/02/2013     Procedure: TOTAL KNEE BILATERAL; Surgeon: Mauri Pole, MD; Location: [facility]; Service: Orthopedics; Laterality: Bilateral;    Family History   Problem  Relation  Age of Onset   .  Breast cancer     .  Pancreatic cancer       uncle   .  Diabetes  Mother    .  Cancer  Mother       breast   .  Irritable bowel syndrome  Sister     Social History: reports that she has never smoked. She has never used smokeless tobacco. She reports that she does not drink alcohol or use illicit drugs.  Allergies:  Allergies   Allergen  Reactions   .  Sulfonamide Derivatives  Anaphylaxis   .  Levaquin [Levofloxacin]      Joint pain   .  Penicillins  Rash    Medications Prior to Admission   Medication  Sig  Dispense  Refill   .  acetaminophen (TYLENOL) 500 MG tablet  Take 500 mg by mouth every 6 (six) hours as needed for mild pain.     .  fexofenadine (ALLEGRA) 180 MG tablet  Take 180 mg by mouth daily.     .  fluticasone (FLONASE) 50 MCG/ACT nasal spray  Place 1 spray into both nostrils daily.     Marland Kitchen  ibuprofen (ADVIL,MOTRIN) 200 MG tablet  Take 400 mg by mouth every 6 (six) hours as needed for mild pain.     Marland Kitchen  lansoprazole (PREVACID) 15 MG capsule  Take 15 mg by mouth daily.     .  traMADol (ULTRAM) 50 MG tablet  Take 50  mg by mouth every 6 (six) hours as needed for moderate pain.     Marland Kitchen  zolpidem (AMBIEN) 10 MG tablet  Take 10 mg by mouth at bedtime as needed for sleep.      Home:  Home Living  Family/patient expects to be discharged to:: Inpatient rehab  Living Arrangements: Spouse/significant other;Children  Home Equipment: [place] - single point  Functional History:  Prior Function  Level of Independence: Independent  Functional Status:  Mobility:  Bed Mobility  Overal bed mobility: Needs Assistance  Bed Mobility: Sit to Supine  Supine to sit: Mod assist;+2 for safety/equipment;HOB elevated  Sit to supine: Mod assist;+2 for safety/equipment  General bed mobility comments: assist for LEs onto bed, pt does well with scooting hips and moving upper body to reposition  Transfers  Overall transfer level: Needs assistance  Equipment used: Rolling walker (2 wheeled)  Transfers: Sit to/from [employer] Transfers  Sit to Stand: +2 physical assistance;Max assist;From elevated  surface  Stand pivot transfers: +2 physical assistance;Mod assist  General transfer comment: verbal cues for technique, able to rise on 2nd attempt this visit, increased assist to rise and steady, pt did better with assisting weight over BOS    ADL:   Cognition:  Cognition  Overall Cognitive Status: Within Functional Limits for tasks assessed  Orientation Level: Oriented X4  Cognition  Arousal/Alertness: Awake/alert  Behavior During Therapy: WFL for tasks assessed/performed  Overall Cognitive Status: Within Functional Limits for tasks assessed  Blood pressure 108/65, pulse 68, temperature 98.3 F (36.8 C), temperature source Oral, resp. rate 16, height 5' 10.5" (1.791 m), weight 131.999 kg (291 lb 0.1 oz), SpO2 92.00%.  Physical Exam  Constitutional: She is oriented to person, place, and time.  HENT:  Head: Normocephalic.  Eyes: EOM are normal.  Neck: Normal range of motion. Neck supple. No thyromegaly present.  Cardiovascular: Normal rate and regular rhythm.  Respiratory: Effort normal and breath sounds normal. No respiratory distress.  GI: Soft. Bowel sounds are normal. She exhibits no distension.  Obese  Neurological: She is alert and oriented to person, place, and time.  Skin:  Bilateral knee incisions are dressed and appropriately tender   Results for orders placed during the hospital encounter of 08/02/13 (from the past 24 hour(s))   CBC Status: Abnormal    Collection Time    08/04/13 4:56 AM   Result  Value  Ref Range    WBC  10.2  4.0 - 10.5 K/uL    RBC  3.46 (*)  3.87 - 5.11 MIL/uL    Hemoglobin  10.3 (*)  12.0 - 15.0 g/dL    HCT  32.0 (*)  36.0 - 46.0 %    MCV  92.5  78.0 - 100.0 fL    MCH  29.8  26.0 - 34.0 pg    MCHC  32.2  30.0 - 36.0 g/dL    RDW  13.9  11.5 - 15.5 %    Platelets  215  150 - 400 K/uL    No results found.  Assessment/Plan:  Diagnosis: endstage OA s/p bilateral TKA's  1. Does the need for close, 24 hr/day medical supervision in concert with  the patient's rehab needs make it unreasonable for this patient to be served in a less intensive setting? Yes 2. Co-Morbidities requiring supervision/potential complications: morbid obesity, abla, htn 3. Due to bladder management, bowel management, safety, skin/wound care, disease management, medication administration, pain management and patient education, does the patient require 24 hr/day rehab nursing?  Yes 4. Does the patient require coordinated care of a physician, rehab nurse, PT (1-2 hrs/day, 5 days/week) and OT (1-2 hrs/day, 5 days/week) to address physical and functional deficits in the context of the above medical diagnosis(es)? Yes Addressing deficits in the following areas: balance, endurance, locomotion, strength, transferring, bowel/bladder control, bathing, dressing, feeding, grooming and toileting 5. Can the patient actively participate in an intensive therapy program of at least 3 hrs of therapy per day at least 5 days per week? Yes 6. The potential for patient to make measurable gains while on inpatient rehab is excellent 7. Anticipated functional outcomes upon discharge from inpatient rehab are modified independent with PT, modified independent with OT, n/a with SLP. 8. Estimated rehab length of stay to reach the above functional goals is: 10-14 days 9. Does the patient have adequate social supports to accommodate these discharge functional goals? Yes 10. Anticipated D/C setting: Home 11. Anticipated post D/C treatments: HH therapy and Outpatient therapy 12. Overall Rehab/Functional Prognosis: excellent RECOMMENDATIONS:  This patient's condition is appropriate for continued rehabilitative care in the following setting: CIR  Patient has agreed to participate in recommended program. Yes  Note that insurance prior authorization may be required for reimbursement for recommended care.  Comment: Rehab Admissions Coordinator to follow up.  Thanks,  Meredith Staggers, MD, Mellody Drown   08/04/2013  Revision History...      Date/Time User Action    08/05/2013 8:24 AM Meredith Staggers, MD Sign    08/04/2013 6:42 AM Cathlyn Parsons, PA-C [place]   View Details Report    Routing History.Marland KitchenMarland Kitchen

## 2013-08-05 NOTE — Progress Notes (Signed)
Rehab admissions - Evaluated for possible admission.  I met with patient and her daughter.  Patient is interested in inpatient rehab admission.  I have called and faxed information to [facility] requesting acute inpatient rehab admission.  I hope to hear back from [facility] later today.  Could potentially admit to CIR later today if I get approval today.   I will notify all once I have a decision from insurance carrier.  Call me for questions.  #[phone]

## 2013-08-05 NOTE — PMR Pre-admission (Signed)
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 Date: 03/18/13     Deduct: $700 (met all)      Out of Pocket Max: $3210 (met $258.63)      Life Max: none CIR: $233 copay per admit then 80%      SNF: $233 copay and then 80% with 100 days max Outpatient: 80% with no limits     Co-Pay: 20% Home Health: 80% with 60 visits max      Co-Pay: 20% DME: 80%     Co-Pay: 20% Providers: in Lobbyist Information   Name Relation Home Work [place] Spouse [phone]  [phone]     Current Medical History  Patient Admitting Diagnosis:  B TKR  History of Present Illness: A 43 y.o. right-handed female history of sleep apnea, and morbid obesity admitted 08/02/2013 with progressive x2-3 years bilateral knee pain secondary to end-stage osteoarthritis and no relief with conservative care including NSAIDs, corticosteroid injections and activity modification. 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 Underwent bilateral total knee replacement 08/02/2013 per Dr. Alvan Dame. Postoperative pain management.  Weightbearing as tolerated bilateral lower extremities. Placed on Xarelto for DVT prophylaxis. Acute blood loss anemia 10.3 and monitored. Physical therapy and occupational evaluations completed 08/04/2011 with recommendations for  physical medicine rehabilitation consult. Patient to be admitted for comprehensive inpatient rehabilitation program.    Past Medical History  Past Medical History  Diagnosis Date  . Arthritis   . Sleep apnea   . Obesity, Class III, BMI 40-49.9 (morbid obesity)   . Heart murmur   . Pneumonia     hx of  . Enlarged thyroid     "all tests are normal"  . GERD (gastroesophageal reflux disease)     with motrin  . Adrenal tumor     "monitoring"  . Anemia     hx of    Family History  family history includes Breast cancer in an other family member; Cancer in her mother; Diabetes in her mother; Irritable bowel syndrome in her sister; Pancreatic cancer in an other family member.  Prior Rehab/Hospitalizations:  Had back injury about 6 yrs ago and had a few sessions of outpatient therapy.   Current Medications  Current facility-administered medications:alum & mag hydroxide-simeth (MAALOX/MYLANTA) 200-200-20 MG/5ML suspension 30 mL, 30 mL, Oral, Q4H PRN, Lucille Passy Babish, PA-C;  bisacodyl (DULCOLAX) suppository 10 mg, 10 mg, Rectal, Daily PRN, Lucille Passy Babish, PA-C;  dexamethasone (DECADRON) injection 10 mg, 10 mg, Intravenous, Once, Lucille Passy Babish, PA-C diphenhydrAMINE (BENADRYL) capsule 25 mg, 25 mg, Oral, Q6H PRN, Lucille Passy Babish, PA-C;  docusate sodium (COLACE) capsule 100 mg, 100 mg, Oral, BID, Lucille Passy Babish, PA-C, 100 mg at 08/05/13 3149;  ferrous sulfate tablet 325 mg, 325 mg, Oral, TID PC, Lucille Passy Babish, PA-C, 325 mg at 08/05/13 0902;  fluticasone (FLONASE) 50 MCG/ACT nasal spray 1 spray, 1 spray, Each Nare, Daily, Lucille Passy Babish, PA-C HYDROmorphone (DILAUDID) injection 0.5-2 mg, 0.5-2 mg, Intravenous, Q2H PRN, Lucille Passy Babish, PA-C, 1 mg at 08/05/13 0019;  ketorolac (TORADOL) 15 MG/ML injection 7.5 mg, 7.5 mg, Intravenous, 4 times per day, Mauri Pole, MD, 7.5 mg at 08/05/13 1151;  loratadine (CLARITIN) tablet 10 mg, 10 mg, Oral, Daily, Lucille Passy Babish, PA-C;  menthol-cetylpyridinium (CEPACOL) lozenge 3 mg, 1 lozenge, Oral, PRN, Lucille Passy Babish, PA-C methocarbamol (ROBAXIN) 500 mg in dextrose 5 % 50 mL IVPB, 500 mg, Intravenous, Q6H PRN, Lucille Passy Babish, PA-C;  methocarbamol (ROBAXIN) tablet 500 mg, 500 mg, Oral, Q6H PRN, Lucille Passy Babish, PA-C, 500 mg at 08/05/13 0306;  metoCLOPramide (REGLAN) injection 5-10 mg, 5-10 mg, Intravenous, Q8H PRN, Lucille Passy Babish, PA-C, 10 mg at 08/03/13 0944;  metoCLOPramide (REGLAN) tablet 5-10 mg, 5-10 mg, Oral, Q8H PRN, Lucille Passy Babish, PA-C ondansetron [facility]) injection 4 mg, 4 mg, Intravenous, Q6H PRN, Lucille Passy Babish, PA-C, 4 mg at 08/03/13 7026;  ondansetron [facility]) tablet 4 mg, 4 mg, Oral, Q6H PRN, Lucille Passy Babish, PA-C;  oxyCODONE (Oxy IR/ROXICODONE) immediate release tablet 5-20 mg, 5-20 mg, Oral, Q3H, Lucille Passy Babish, PA-C, 20 mg at 08/05/13 1151;  pantoprazole (PROTONIX) EC tablet 20 mg, 20 mg, Oral, Daily, Lucille Passy Babish, PA-C, 20 mg at 08/05/13 0902 phenol (CHLORASEPTIC) mouth spray 1 spray, 1 spray, Mouth/Throat, PRN, Lucille Passy Babish, PA-C;  polyethylene glycol (MIRALAX / GLYCOLAX) packet 17 g, 17 g, Oral, BID, Lucille Passy Babish, PA-C, 17 g at 08/05/13 0901;  promethazine (PHENERGAN) injection 6.25-12.5 mg, 6.25-12.5 mg, Intravenous, Q6H PRN, Lucille Passy Babish, PA-C, 12.5 mg at 08/03/13 1753 rivaroxaban (XARELTO) tablet 10 mg, 10 mg, Oral, Daily, Lucille Passy Babish, PA-C, 10 mg at 08/05/13 3785;  sodium chloride 0.9 % 1,000 mL with potassium chloride 10 mEq infusion, , Intravenous, Continuous, Lucille Passy Babish, PA-C, Last Rate: 20 mL/hr at 08/04/13 0132;  zolpidem (AMBIEN) tablet 5 mg, 5 mg, Oral, QHS PRN, Lucille Passy Babish, PA-C  Patients Current Diet: General  Precautions / Restrictions Precautions Precautions: Fall;Knee Restrictions Weight Bearing Restrictions: No Other Position/Activity Restrictions: WBAT   Prior Activity  Level Community (5-7x/wk): Worked FT as a Pharmacist, 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  Home Assistive Devices / Equipment Home Assistive Devices/Equipment: Eyeglasses;Cane (specify quad or straight) Home Equipment: Cane - single point  Prior Functional Level Prior Function Level of Independence: Independent  Current Functional Level Cognition  Overall Cognitive Status: Within Functional Limits for tasks assessed Orientation Level: Oriented X4    Extremity Assessment (includes Sensation/Coordination)  Lower Extremity Assessment: RLE deficits/detail;LLE deficits/detail  RLE Deficits / Details: reports full sensation bilaterally, functional AAROM sitting EOB knee flexion 40*  LLE Deficits / Details: reports full sensation bilaterally, functional AAROM sitting EOB knee flexion 40*    ADLs  Overall ADL's : Needs assistance/impaired Grooming: Set up;Sitting Upper Body Bathing: Set up;Sitting Lower Body Bathing: +2 for physical assistance;Moderate assistance;Sit to/from stand Upper Body Dressing : Minimal assistance;Sitting (due to lines) Lower Body Dressing: +2 for physical assistance;Maximal assistance;Sit to/from stand Toilet Transfer: +2 for physical assistance;Moderate assistance;RW Toileting- Clothing Manipulation and Hygiene: +2 for physical assistance;Maximal assistance;Sit to/from stand Functional mobility during ADLs: +2  for physical assistance;Moderate assistance;Rolling walker General ADL Comments: performed adl chair level then returned with Physical Therapy later to assist with peri areas.  Pt has difficulty weight shifting forward in standing and needs cues/physical assistance.  Also needs cues for walker distance as she tends to keep body too close to front of RW.  Pt is able to reach to feet to start socks but needs assistance with socks,shoes and to pull pants over hips.  Educated on AE--pt may potentially benefit from sock aid later    Mobility  Overal bed mobility: Needs Assistance Bed  Mobility: Sit to Supine Supine to sit: Mod assist;HOB elevated Sit to supine: Mod assist General bed mobility comments: assist for bil LEs    Transfers  Overall transfer level: Needs assistance Equipment used: Rolling walker (2 wheeled) Transfers: Sit to/from Stand Sit to Stand: +2 physical assistance;Mod assist Stand pivot transfers: +2 physical assistance;Min assist General transfer comment: verbal cues for UE and LE placement, correction to previous note as KI used on L LE has this is the LE which is more painful, cues for walking feet and weight shifting    Ambulation / Gait / Stairs / Wheelchair Mobility  Ambulation/Gait Ambulation/Gait assistance: +2 physical assistance;+2 safety/equipment;Mod assist Ambulation Distance (Feet): 11 Feet Assistive device: Rolling walker (2 wheeled) Gait Pattern/deviations: Step-to pattern General Gait Details: increased effort and lateral lean to advance LEs, difficulty keeping weight under BOS with cues for RW distance (tends to keep to close) and maintaing weight over BOS, +2 for assist and then another person following with recliner for safety    Posture / Balance      Special needs/care consideration BiPAP/CPAP Yes, has CPAP at home and in hospital CPM No Continuous Drip IV No  Dialysis No         Life Vest No Oxygen No Special Bed No Trach Size No Wound Vac (area) No     Skin B TKR incisions                            Bowel mgmt: No BM since admission.  Last BM 08/01/13 Bladder mgmt: Voiding up on BSC Diabetic mgmt No    Previous Home Environment Living Arrangements: Spouse/significant other;Children Home Care Services: No Additional Comments: pt has a tub/shower  Discharge Living Setting Plans for Discharge Living Setting: Patient's home;House;Lives with (comment) (Lives with husband and 52 yo son.) Type of Home at Discharge: House Discharge Home Layout: Two level;Able to live on main level with bedroom/bathroom Alternate  Level Stairs-Number of Steps: Flight Discharge Home Access: Stairs to enter [employer] of Steps: 4 steps front entry, 23 steps back deck entry. Does the patient have any problems obtaining your medications?: No  Social/Family/Support Systems Patient Roles: Spouse;Parent (Has a husband and 3 children ages 34, 31, and 2) Contact Information: Loretha Ure - spouse Anticipated Caregiver: Husband and children Anticipated Caregiver's Contact Information: Herbie [place] - spouse [phone] Ability/Limitations of Caregiver: Husband works FT as a Pharmacist, hospital.  Children all work.  Spouse and children will take turns sharing caregiver responsibilities Caregiver Availability: Intermittent Discharge Plan Discussed with Primary Caregiver: Yes Is Caregiver In Agreement with Plan?: Yes Does Caregiver/Family have Issues with Lodging/Transportation while Pt is in Rehab?: No  Goals/Additional Needs Patient/Family Goal for Rehab: PT/OT mod I goals Expected length of stay: 10-14 days Cultural Considerations: None Dietary Needs: Regular diet, thin liquids Equipment Needs: TBD Pt/Family Agrees to Admission and willing to participate: Yes  Program Orientation Provided & Reviewed with Pt/Caregiver Including Roles  & Responsibilities: Yes  Decrease burden of Care through IP rehab admission: N/A  Possible need for SNF placement upon discharge: Not planned  Patient Condition: This patient's condition remains as documented in the consult dated 08/05/13, in which the Rehabilitation Physician determined and documented that the patient's condition is appropriate for intensive rehabilitative care in an inpatient rehabilitation facility. Will admit to inpatient rehab today.  Preadmission Screen Completed By:  [name], 08/05/2013 12:52 PM ______________________________________________________________________   Discussed status with Dr. Letta Pate on 08/05/13 at 1252 and received telephone approval for  admission today.  Admission Coordinator:  [name], time1253/Date05/21/15

## 2013-08-05 NOTE — H&P (Signed)
Physical Medicine and Rehabilitation Admission H&P  No chief complaint on file.  :  Chief complaint: Knee pain  HPI: [name] is a 43 y.o. right-handed female history of sleep apnea, and morbid obesity admitted 08/02/2013 with progressive x2-3 years bilateral knee pain secondary to end-stage osteoarthritis and no relief with conservative care including NSAIDs, corticosteroid injections and activity modification. 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 Underwent bilateral total knee replacement 08/02/2013 per Dr. Alvan Dame. Postoperative pain management.  Weightbearing as tolerated bilateral lower extremities. Placed on Xarelto for DVT prophylaxis. Acute blood loss anemia 10.3 and monitored. Physical therapy and occupational evaluations completed 08/04/2011 with recommendations for physical medicine rehabilitation consult. Patient was admitted for comprehensive rehabilitation program   No BM since surgery but had BMs today. Has been taking Colace as well as MiraLax. No suppositories as far Pain has been relatively well controlled thus far ROS Review of Systems  Gastrointestinal: Positive for constipation.  GERD  Musculoskeletal: Positive for joint pain and myalgias.  All other systems reviewed and are negative    Past Medical History    Diagnosis  Date    .  Arthritis     .  Sleep apnea     .  Obesity, Class III, BMI 40-49.9 (morbid obesity)     .  Heart murmur     .  Pneumonia       hx of    .  Enlarged thyroid       "all tests are normal"    .  GERD (gastroesophageal reflux disease)       with motrin    .  Adrenal tumor       "monitoring"    .  Anemia       hx of       Past Surgical History    Procedure  Laterality  Date    .  Abdominal hysterectomy   03/2003      partial    .  Tubal ligation      .  Lipoma excision   01/11/2010    .  Cholecystectomy   2012    .  Total knee arthroplasty  Bilateral  08/02/2013      Procedure: TOTAL KNEE BILATERAL;  Surgeon: Mauri Pole, MD; Location: [facility]; Service: Orthopedics; Laterality: Bilateral;       Family History    Problem  Relation  Age of Onset    .  Breast cancer      .  Pancreatic cancer        uncle    .  Diabetes  Mother     .  Cancer  Mother       breast    .  Irritable bowel syndrome  Sister      Social History: reports that she has never smoked. She has never used smokeless tobacco. She reports that she does not drink alcohol or use illicit drugs.  Allergies:    Allergies    Allergen  Reactions    .  Sulfonamide Derivatives  Anaphylaxis    .  Levaquin [Levofloxacin]       Joint pain    .  Penicillins  Rash       Medications Prior to Admission    Medication  Sig  Dispense  Refill    .  acetaminophen (TYLENOL) 500 MG tablet  Take 500 mg by mouth every 6 (six) hours as needed for mild pain.      Marland Kitchen  fexofenadine (ALLEGRA) 180 MG tablet  Take 180 mg by mouth daily.      .  fluticasone (FLONASE) 50 MCG/ACT nasal spray  Place 1 spray into both nostrils daily.      Marland Kitchen  ibuprofen (ADVIL,MOTRIN) 200 MG tablet  Take 400 mg by mouth every 6 (six) hours as needed for mild pain.      Marland Kitchen  lansoprazole (PREVACID) 15 MG capsule  Take 15 mg by mouth daily.      .  traMADol (ULTRAM) 50 MG tablet  Take 50 mg by mouth every 6 (six) hours as needed for moderate pain.      Marland Kitchen  zolpidem (AMBIEN) 10 MG tablet  Take 10 mg by mouth at bedtime as needed for sleep.       Home:  Home Living  Family/patient expects to be discharged to:: Inpatient rehab  Living Arrangements: Spouse/significant other;Children  Home Equipment: [place] - single point  Additional Comments: pt has a tub/shower  Functional History:  Prior Function  Level of Independence: Independent  Functional Status:  Mobility:  Bed Mobility  Overal bed mobility: Needs Assistance  Bed Mobility: Sit to Supine  Supine to sit: Mod assist;HOB elevated  Sit to supine: Mod assist  General bed mobility comments: assist for bil LEs   Transfers  Overall transfer level: Needs assistance  Equipment used: Rolling walker (2 wheeled)  Transfers: Sit to/from Stand  Sit to Stand: +2 physical assistance;Mod assist  Stand pivot transfers: +2 physical assistance;Min assist  General transfer comment: verbal cues for UE and LE placement, correction to previous note as KI used on L LE has this is the LE which is more painful, cues for walking feet and weight shifting  Ambulation/Gait  Ambulation/Gait assistance: +2 physical assistance;+2 safety/equipment;Mod assist  Ambulation Distance (Feet): 11 Feet  Assistive device: Rolling walker (2 wheeled)  Gait Pattern/deviations: Step-to pattern  General Gait Details: increased effort and lateral lean to advance LEs, difficulty keeping weight under BOS with cues for RW distance (tends to keep to close) and maintaing weight over BOS, +2 for assist and then another person following with recliner for safety   ADL:  ADL  Overall ADL's : Needs assistance/impaired  Grooming: Set up;Sitting  Upper Body Bathing: Set up;Sitting  Lower Body Bathing: +2 for physical assistance;Moderate assistance;Sit to/from stand  Upper Body Dressing : Minimal assistance;Sitting (due to lines)  Lower Body Dressing: +2 for physical assistance;Maximal assistance;Sit to/from stand  Toilet Transfer: +2 for physical assistance;Moderate assistance;RW  Toileting- Clothing Manipulation and Hygiene: +2 for physical assistance;Maximal assistance;Sit to/from stand  Functional mobility during ADLs: +2 for physical assistance;Moderate assistance;Rolling walker  General ADL Comments: performed adl chair level then returned with Physical Therapy later to assist with peri areas. Pt has difficulty weight shifting forward in standing and needs cues/physical assistance. Also needs cues for walker distance as she tends to keep body too close to front of RW. Pt is able to reach to feet to start socks but needs assistance with socks,shoes  and to pull pants over hips. Educated on AE--pt may potentially benefit from sock aid later  Cognition:  Cognition  Overall Cognitive Status: Within Functional Limits for tasks assessed  Orientation Level: Oriented X4  Cognition  Arousal/Alertness: Awake/alert  Behavior During Therapy: WFL for tasks assessed/performed  Overall Cognitive Status: Within Functional Limits for tasks assessed  Physical Exam:  Blood pressure 143/76, pulse 91, temperature 98.2 F (36.8 C), temperature source Oral, resp. rate 18, height 5' 10.5" (1.791 m),  weight 131.999 kg (291 lb 0.1 oz), SpO2 99.00%.  Physical Exam  Constitutional: She is oriented to person, place, and time.  HENT:  Head: Normocephalic.  Eyes: EOM are normal.  Neck: Normal range of motion. Neck supple. No thyromegaly present.  Cardiovascular: Normal rate and regular rhythm.  Respiratory: Effort normal and breath sounds normal. No respiratory distress.  GI: Soft. Bowel sounds are normal. She exhibits no distension.  Obese  Neurological: She is alert and oriented to person, place, and time.  Skin:  Bilateral knee incisions are dressed and appropriately tender . Dorsi flexion plantar flexion intact  Motor strength is 5/5 bilateral deltoid, bicep, tricep, grip 2 minus hip flexion trace knee extension 4/5 ankle dorsiflexion plantar flexion Sensory intact to light touch in bilateral upper and lower limbs    Results for orders placed during the hospital encounter of 08/02/13 (from the past 48 hour(s))    CBC Status: Abnormal     Collection Time     08/04/13 4:56 AM    Result  Value  Ref Range     WBC  10.2  4.0 - 10.5 K/uL     RBC  3.46 (*)  3.87 - 5.11 MIL/uL     Hemoglobin  10.3 (*)  12.0 - 15.0 g/dL     HCT  32.0 (*)  36.0 - 46.0 %     MCV  92.5  78.0 - 100.0 fL     MCH  29.8  26.0 - 34.0 pg     MCHC  32.2  30.0 - 36.0 g/dL     RDW  13.9  11.5 - 15.5 %     Platelets  215  150 - 400 K/uL    BASIC METABOLIC PANEL Status: Abnormal      Collection Time     08/04/13 4:56 AM    Result  Value  Ref Range     Sodium  138  137 - 147 mEq/L     Potassium  4.1  3.7 - 5.3 mEq/L     Chloride  104  96 - 112 mEq/L     CO2  26  19 - 32 mEq/L     Glucose, Bld  118 (*)  70 - 99 mg/dL     BUN  11  6 - 23 mg/dL     Creatinine, Ser  0.61  0.50 - 1.10 mg/dL     Calcium  8.1 (*)  8.4 - 10.5 mg/dL     GFR calc non Af Amer  >90  >90 mL/min     GFR calc Af Amer  >90  >90 mL/min     Comment:  (NOTE)      The eGFR has been calculated using the CKD EPI equation.      This calculation has not been validated in all clinical situations.      eGFR's persistently <90 mL/min signify possible Chronic Kidney      Disease.     No results found.  Medical Problem List and Plan:  1. 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 DVT Prophylaxis/Anticoagulation: Xarelto. Monitor for bleeding episodes. Check vascular study  3. Pain Management: Oxycodone and Robaxin as needed. Monitor with increased mobility  4. acute blood loss anemia. Continue iron supplement. Followup CBC  5. Neuropsych: This patient is capable of making decisions on her own behalf.  6. Morbid obesity. BMI 41.15 kg. Discuss weight loss diet dietary consult  7.  GERD. Protonix  Post Admission Physician Evaluation:  1. Functional deficits secondary to bilateral end-stage osteoarthritis of the knee status post bilateral TK R.. 2. Patient is admitted to receive collaborative, interdisciplinary care between the physiatrist, rehab nursing staff, and therapy team. 3. Patient's level of medical complexity and substantial therapy needs in context of that medical necessity cannot be provided at a lesser intensity of care such as a SNF. 4. Patient has experienced substantial functional loss from his/her baseline which was documented above under the "Functional History" and "Functional Status" headings. Judging by the patient's  diagnosis, physical exam, and functional history, the patient has potential for functional progress which will result in measurable gains while on inpatient rehab. These gains will be of substantial and practical use upon discharge in facilitating mobility and self-care at the household level. 5. Physiatrist will provide 24 hour management of medical needs as well as oversight of the therapy plan/treatment and provide guidance as appropriate regarding the interaction of the two. 6. 24 hour rehab nursing will assist with bladder management, bowel management, safety, skin/wound care, disease management, medication administration, pain management and patient education and help integrate therapy concepts, techniques,education, etc. 7. PT will assess and treat for/with: pre gait, gait training, endurance , safety, equipment, neuromuscular re education. Goals are: Mod I mobility. 8. OT will assess and treat for/with: ADLs, Cognitive perceptual skills, Neuromuscular re education, safety, endurance, equipment. Goals are: Mod I ADLs. 9. SLP will assess and treat for/with: NA. Goals are: NA. 10. Case Management and Social Worker will assess and treat for psychological issues and discharge planning. 11. Team conference will be held weekly to assess progress toward goals and to determine barriers to discharge. 12. Patient will receive at least 3 hours of therapy per day at least 5 days per week. 13. ELOS: 10-12d  14. Prognosis: excellent  Charlett Blake M.D. [facility] FAAPM&R (Sports Med, Neuromuscular Med) Diplomate Am Board of Electrodiagnostic Med   08/05/2013

## 2013-08-05 NOTE — Progress Notes (Signed)
Report called to RN, on [place], inpatient rehab at [facility]. All questions answered.  [facility] called, and was given the necessary information to transport patient to [facility].   Patient transported via [facility] to [place].

## 2013-08-05 NOTE — Progress Notes (Signed)
Patient arrived via [facility] from [facility]. Oriented to room, fall prevention plan, rehab safety plan, rehab process, call bell, and bed alarm with verbal understanding. Patient resting comfortably in bed with call bell at side. Rates pain at a 4 out of 10. Had pain medication before transfer from [facility]. Will continue to monitor.

## 2013-08-05 NOTE — Progress Notes (Signed)
Physical Therapy Treatment Patient Details Name: [name] MRN: [id number] DOB: 04/24/1970 Today's Date: 08/05/2013    History of Present Illness Pt is a 43 year old female s/p bilateral TKRs with hx of morbid obesity.    PT Comments    Pt able to ambulate into hallway today and performed exercises.  Pt plans to d/c to CIR.  Follow Up Recommendations  [facility]     Equipment Recommendations  Rolling walker with 5" wheels (wide)    Recommendations for Other Services       Precautions / Restrictions Precautions Precautions: Fall;Knee Restrictions Other Position/Activity Restrictions: WBAT    Mobility  Bed Mobility               General bed mobility comments: pt up in recliner on arrival  Transfers Overall transfer level: Needs assistance Equipment used: Rolling walker (2 wheeled) Transfers: Sit to/from Stand Sit to Stand: +2 physical assistance;Mod assist         General transfer comment: verbal cues for UE and LE placement, KI used on L LE, cues for walking feet and weight shifting  Ambulation/Gait Ambulation/Gait assistance: +2 physical assistance;Mod assist;+2 safety/equipment Ambulation Distance (Feet): 16 Feet Assistive device: Rolling walker (2 wheeled) Gait Pattern/deviations: Step-to pattern     General Gait Details: increased effort and lateral lean to advance LEs, pt did better today with keeping weight under BOS with constant cues, +2 for assist and then another person following with recliner for safety, pt required one seated rest break due to fatigue and pain   Stairs            Wheelchair Mobility    Modified Rankin (Stroke Patients Only)       Balance                                    Cognition Arousal/Alertness: Awake/alert Behavior During Therapy: WFL for tasks assessed/performed Overall Cognitive Status: Within Functional Limits for tasks assessed                      Exercises Total Joint  Exercises Ankle Circles/Pumps: AROM;Both;10 reps Quad Sets: AROM;Both;10 reps Short Arc QuadSinclair Ship;Both;10 reps Heel Slides: AAROM;10 reps;Both Hip ABduction/ADduction: Both;10 reps;AAROM Goniometric ROM: AAROM bil knee flexion less than 65* with L more limited then R    General Comments        Pertinent Vitals/Pain Premedicated for session, ice packs applied end of session, activity to tolerance    Home Living                      Prior Function            PT Goals (current goals can now be found in the care plan section) Progress towards PT goals: Progressing toward goals    Frequency  7X/week    PT Plan Current plan remains appropriate    Co-evaluation             End of Session Equipment Utilized During Treatment: Gait belt;Left knee immobilizer Activity Tolerance: Patient tolerated treatment well Patient left: in chair;with call bell/phone within reach;with family/visitor present     Time: 7425-9563 PT Time Calculation (min): 29 min  Charges:  $Gait Training: 8-22 mins $Therapeutic Exercise: 8-22 mins                    G Codes:  Junius Argyle 08/05/2013, 1:10 PM Carmelia Bake, PT, DPT 08/05/2013 Pager: [phone]

## 2013-08-05 NOTE — Progress Notes (Signed)
Pt has home CPAP. Pt places self on and off. RT made pt aware that if she had any questions to call we are here all night.

## 2013-08-05 NOTE — Discharge Summary (Signed)
Physician Discharge Summary  Patient ID: [name] MRN: [id number] DOB/AGE: December 16, 1970 43 y.o.  Admit date: 08/02/2013 Discharge date:  08/05/2013  Procedures:  Procedure(s) (LRB): TOTAL KNEE BILATERAL (Bilateral)  Attending Physician:  Dr. Paralee Cancel   Admission Diagnoses:   Bilateral OA / knee pain  Discharge Diagnoses:  Principal Problem:   S/P bilateral TKA Active Problems:   Morbid obesity   Expected blood loss anemia  Past Medical History  Diagnosis Date  . Arthritis   . Sleep apnea   . Obesity, Class III, BMI 40-49.9 (morbid obesity)   . Heart murmur   . Pneumonia     hx of  . Enlarged thyroid     "all tests are normal"  . GERD (gastroesophageal reflux disease)     with motrin  . Adrenal tumor     "monitoring"  . Anemia     hx of    HPI: [name], 43 y.o. female, has a history of pain and functional disability in the bilateral knee due to arthritis and has failed non-surgical conservative treatments for greater than 12 weeks to include NSAID's and/or analgesics, corticosteriod injections, viscosupplementation injections and activity modification. Onset of symptoms was gradual, starting 2-3 years ago with gradually worsening course since that time. The patient noted no past surgery on bilateral knee(s). Patient currently rates pain in the bilateral knee(s) at 9 out of 10 with activity. Patient has night pain, worsening of pain with activity and weight bearing, pain that interferes with activities of daily living, pain with passive range of motion, crepitus and joint swelling. Patient has evidence of periarticular osteophytes and joint space narrowing by imaging studies. There is no active infection. Risks, benefits and expectations were discussed with the patient. Risks including but not limited to the risk of anesthesia, blood clots, nerve damage, blood vessel damage, failure of the prosthesis, infection and up to and including death. Patient understand  the risks, benefits and expectations and wishes to proceed with surgery.   PCP: Selinda Orion   Discharged Condition: good  Hospital Course:  Patient underwent the above stated procedure on 08/02/2013. Patient tolerated the procedure well and brought to the recovery room in good condition and subsequently to the floor.  POD #1 BP: 93/55 ; Pulse: 44   Patient reports pain as moderate to advanced. Epidural not very effective, some concerns that it was leaking. Otherwise no events. Neurovascular intact, dorsiflexion/plantar flexion intact, incision: dressing C/D/I, no cellulitis present and compartment soft.   LABS  Basename    HGB  10.9  HCT  31.6   POD #2  BP: 102/65 ; Pulse: 65 ; Temp: 98.6 F (37 C) ; Resp: 17 Patient reports pain as mild, pain controlled. No events throughout the night. Denies any CP or SOB. States that the pain medication is helping but makes her a little sleepy. Neurovascular intact, dorsiflexion/plantar flexion intact, incision: dressing C/D/I, no cellulitis present and compartment soft.   LABS  Basename    HGB  10.3  HCT  32.0   POD #3  BP: 143/76 ; Pulse: 91 ; Temp: 98.2 F (36.8 C) ; Resp: 18  Patient reports pain as mild, pain controlled. No events throughout the night. States that she is ready to work with PT and get better.  Ready to be discharged to rehab. Neurovascular intact, dorsiflexion/plantar flexion intact, incision: dressing C/D/I, no cellulitis present and compartment soft.   LABS   No new labs  Discharge Exam: General appearance: alert, cooperative  and no distress Extremities: Homans sign is negative, no sign of DVT, no edema, redness or tenderness in the calves or thighs and no ulcers, gangrene or trophic changes  Disposition: Rehabilitation with follow up in 2 weeks   Follow-up Information   Follow up with Mauri Pole, MD. Schedule an appointment as soon as possible for a visit in 2 weeks.   Specialty:  Orthopedic Surgery    Contact information:   [address] [place] [address] [phone]       Discharge Instructions   Call MD / Call 911    Complete by:  As directed   If you experience chest pain or shortness of breath, CALL 911 and be transported to the hospital emergency room.  If you develope a fever above 101 F, pus (white drainage) or increased drainage or redness at the wound, or calf pain, call your surgeon's office.     Change dressing    Complete by:  As directed   Maintain surgical dressing for 10-14 days, or until follow up in the clinic.     Constipation Prevention    Complete by:  As directed   Drink plenty of fluids.  Prune juice may be helpful.  You may use a stool softener, such as Colace (over the counter) 100 mg twice a day.  Use MiraLax (over the counter) for constipation as needed.     Diet - low sodium heart healthy    Complete by:  As directed      Discharge instructions    Complete by:  As directed   Maintain surgical dressing for 10-14 days, or until follow up in the clinic. Follow up in 2 weeks at [facility]. Call with any questions or concerns.     Driving restrictions    Complete by:  As directed   No driving for 4 weeks     Increase activity slowly as tolerated    Complete by:  As directed      TED hose    Complete by:  As directed   Use stockings (TED hose) for 2 weeks on both leg(s).  You may remove them at night for sleeping.     Weight bearing as tolerated    Complete by:  As directed              Medication List    STOP taking these medications       ibuprofen 200 MG tablet  Commonly known as:  ADVIL,MOTRIN     traMADol 50 MG tablet  Commonly known as:  ULTRAM      TAKE these medications       acetaminophen 500 MG tablet  Commonly known as:  TYLENOL  Take 500 mg by mouth every 6 (six) hours as needed for mild pain.     aspirin EC 325 MG tablet  Take 1 tablet (325 mg total) by mouth 2 (two) times daily. Take  for 4 weeks.  Start taking on:  08/20/2013     DSS 100 MG Caps  Take 100 mg by mouth 2 (two) times daily.     ferrous sulfate 325 (65 FE) MG tablet  Take 1 tablet (325 mg total) by mouth 3 (three) times daily after meals.     fexofenadine 180 MG tablet  Commonly known as:  ALLEGRA  Take 180 mg by mouth daily.     fluticasone 50 MCG/ACT nasal spray  Commonly known as:  FLONASE  Place 1 spray into  both nostrils daily.     lansoprazole 15 MG capsule  Commonly known as:  PREVACID  Take 15 mg by mouth daily.     methocarbamol 500 MG tablet  Commonly known as:  ROBAXIN  Take 1 tablet (500 mg total) by mouth every 6 (six) hours as needed for muscle spasms.     oxyCODONE 5 MG immediate release tablet  Commonly known as:  Oxy IR/ROXICODONE  Take 1-4 tablets (5-20 mg total) by mouth every 3 (three) hours.     polyethylene glycol packet  Commonly known as:  MIRALAX / GLYCOLAX  Take 17 g by mouth 2 (two) times daily.     rivaroxaban 10 MG Tabs tablet  Commonly known as:  XARELTO  Take 1 tablet (10 mg total) by mouth daily.     zolpidem 10 MG tablet  Commonly known as:  AMBIEN  Take 10 mg by mouth at bedtime as needed for sleep.           Signed: West Pugh. Lowella Kindley   PAC  08/05/2013, 9:00 AM

## 2013-08-05 NOTE — Progress Notes (Signed)
Discharge summary sent to payer through [facility]  

## 2013-08-06 ENCOUNTER — Inpatient Hospital Stay (HOSPITAL_COMMUNITY): Payer: BC Managed Care – PPO

## 2013-08-06 ENCOUNTER — Inpatient Hospital Stay (HOSPITAL_COMMUNITY): Payer: BC Managed Care – PPO | Admitting: Occupational Therapy

## 2013-08-06 ENCOUNTER — Encounter (HOSPITAL_COMMUNITY): Payer: BC Managed Care – PPO | Admitting: Occupational Therapy

## 2013-08-06 DIAGNOSIS — Z96659 Presence of unspecified artificial knee joint: Secondary | ICD-10-CM

## 2013-08-06 DIAGNOSIS — M1710 Unilateral primary osteoarthritis, unspecified knee: Secondary | ICD-10-CM

## 2013-08-06 DIAGNOSIS — M79609 Pain in unspecified limb: Secondary | ICD-10-CM

## 2013-08-06 DIAGNOSIS — M7989 Other specified soft tissue disorders: Secondary | ICD-10-CM

## 2013-08-06 LAB — COMPREHENSIVE METABOLIC PANEL
ALK PHOS: 153 U/L — AB (ref 39–117)
ALT: 76 U/L — ABNORMAL HIGH (ref 0–35)
AST: 54 U/L — AB (ref 0–37)
Albumin: 2.7 g/dL — ABNORMAL LOW (ref 3.5–5.2)
BILIRUBIN TOTAL: 1 mg/dL (ref 0.3–1.2)
BUN: 8 mg/dL (ref 6–23)
CHLORIDE: 99 meq/L (ref 96–112)
CO2: 26 meq/L (ref 19–32)
Calcium: 8.3 mg/dL — ABNORMAL LOW (ref 8.4–10.5)
Creatinine, Ser: 0.49 mg/dL — ABNORMAL LOW (ref 0.50–1.10)
GFR calc Af Amer: >90 mL/min (ref >90)
GFR calc non Af Amer: >90 mL/min (ref >90)
Glucose, Bld: 143 mg/dL — ABNORMAL HIGH (ref 70–99)
Potassium: 3.6 mEq/L — ABNORMAL LOW (ref 3.7–5.3)
Sodium: 137 mEq/L (ref 137–147)
Total Protein: 6.5 g/dL (ref 6.0–8.3)

## 2013-08-06 LAB — CBC WITH DIFFERENTIAL/PLATELET
BASOS ABS: 0 10*3/uL (ref 0.0–0.1)
BASOS PCT: 0 % (ref 0–1)
Eosinophils Absolute: 0.2 10*3/uL (ref 0.0–0.7)
Eosinophils Relative: 2 % (ref 0–5)
HCT: 32.2 % — ABNORMAL LOW (ref 36.0–46.0)
HEMOGLOBIN: 10.7 g/dL — AB (ref 12.0–15.0)
Lymphocytes Relative: 18 % (ref 12–46)
Lymphs Abs: 1.7 10*3/uL (ref 0.7–4.0)
MCH: 30.7 pg (ref 26.0–34.0)
MCHC: 33.2 g/dL (ref 30.0–36.0)
MCV: 92.5 fL (ref 78.0–100.0)
Monocytes Absolute: 0.6 10*3/uL (ref 0.1–1.0)
Monocytes Relative: 6 % (ref 3–12)
NEUTROS PCT: 74 % (ref 43–77)
Neutro Abs: 7.1 10*3/uL (ref 1.7–7.7)
Platelets: 273 10*3/uL (ref 150–400)
RBC: 3.48 MIL/uL — ABNORMAL LOW (ref 3.87–5.11)
RDW: 14 % (ref 11.5–15.5)
WBC: 9.6 10*3/uL (ref 4.0–10.5)

## 2013-08-06 MED ORDER — OXYCODONE HCL ER 20 MG PO T12A
20.0000 mg | EXTENDED_RELEASE_TABLET | Freq: Two times a day (BID) | ORAL | Status: DC
  Administered 2013-08-07 – 2013-08-09 (×5): 20 mg via ORAL
  Filled 2013-08-06 (×5): qty 1

## 2013-08-06 MED ORDER — OXYCODONE HCL ER 10 MG PO T12A
10.0000 mg | EXTENDED_RELEASE_TABLET | Freq: Two times a day (BID) | ORAL | Status: DC
  Administered 2013-08-06 (×2): 10 mg via ORAL
  Filled 2013-08-06 (×2): qty 1

## 2013-08-06 MED ORDER — METHOCARBAMOL 500 MG PO TABS
500.0000 mg | ORAL_TABLET | Freq: Four times a day (QID) | ORAL | Status: DC
  Administered 2013-08-06 – 2013-08-13 (×29): 500 mg via ORAL
  Filled 2013-08-06 (×40): qty 1

## 2013-08-06 MED ORDER — OXYCODONE HCL 5 MG PO TABS
5.0000 mg | ORAL_TABLET | ORAL | Status: DC | PRN
  Administered 2013-08-07 (×2): 20 mg via ORAL
  Administered 2013-08-07: 15 mg via ORAL
  Administered 2013-08-07 – 2013-08-11 (×29): 20 mg via ORAL
  Administered 2013-08-11: 5 mg via ORAL
  Administered 2013-08-11 (×2): 20 mg via ORAL
  Administered 2013-08-11: 15 mg via ORAL
  Administered 2013-08-12 – 2013-08-13 (×8): 20 mg via ORAL
  Filled 2013-08-06: qty 4
  Filled 2013-08-06: qty 1
  Filled 2013-08-06 (×4): qty 4
  Filled 2013-08-06: qty 1
  Filled 2013-08-06 (×17): qty 4
  Filled 2013-08-06: qty 3
  Filled 2013-08-06 (×13): qty 4
  Filled 2013-08-06: qty 3
  Filled 2013-08-06 (×6): qty 4

## 2013-08-06 NOTE — Progress Notes (Signed)
  [facility] PHYSICAL MEDICINE & REHABILITATION     PROGRESS NOTE    Subjective/Complaints: Having a lot of pain in knees, some spasm. Had a rough night  Objective: Vital Signs: Blood pressure 120/58, pulse 71, temperature 98.1 F (36.7 C), temperature source Oral, resp. rate 20, height 5\' 10"  (1.778 m), weight 140.7 kg (310 lb 3 oz), SpO2 100.00%. No results found.  Recent Labs  08/04/13 0456  WBC 10.2  HGB 10.3*  HCT 32.0*  PLT 215    Recent Labs  08/04/13 0456  NA 138  K 4.1  CL 104  GLUCOSE 118*  BUN 11  CREATININE 0.61  CALCIUM 8.1*   CBG (last 3)  No results found for this basename: GLUCAP,  in the last 72 hours  Wt Readings from Last 3 Encounters:  08/05/13 140.7 kg (310 lb 3 oz)  08/02/13 131.999 kg (291 lb 0.1 oz)  08/02/13 131.999 kg (291 lb 0.1 oz)    Physical Exam:  Constitutional: She is oriented to person, place, and time.  HENT:  Head: Normocephalic.  Eyes: EOM are normal.  Neck: Normal range of motion. Neck supple. No thyromegaly present.  Cardiovascular: Normal rate and regular rhythm.  Respiratory: Effort normal and breath sounds normal. No respiratory distress.  GI: Soft. Bowel sounds are normal. She exhibits no distension.  Obese  Neurological: She is alert and oriented to person, place, and time.  Skin:  Bilateral knee incisions are dressed and appropriately tender. No erythema. Minimal dressingDorsi flexion plantar flexion intact  Motor strength is 5/5 bilateral deltoid, bicep, tricep, grip 2 minus hip flexion trace knee extension 4/5 ankle dorsiflexion plantar flexion  Sensory intact to light touch in bilateral upper and lower limbs  Musc: edema around knees, appropriate. Knees tender to palpation, ROM Psych: a little anxious  Assessment/Plan: 1. Functional deficits secondary to OA of knees s/p bilateral TKA's which require 3+ hours per day of interdisciplinary therapy in a comprehensive inpatient rehab setting. Physiatrist is  providing close team supervision and 24 hour management of active medical problems listed below. Physiatrist and rehab team continue to assess barriers to discharge/monitor patient progress toward functional and medical goals. FIM:                   Comprehension Comprehension Mode: Auditory Comprehension: 5-Follows basic conversation/direction: With no assist  Expression Expression Mode: Verbal Expression: 5-Expresses basic needs/ideas: With no assist  Social Interaction Social Interaction: 7-Interacts appropriately with others - No medications needed.  Problem Solving Problem Solving: 7-Solves complex problems: Recognizes & self-corrects  Memory Memory: 7-Complete Independence: No helper  Medical Problem List and Plan:  1. 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 DVT Prophylaxis/Anticoagulation: Xarelto. Monitor for bleeding episodes. Check vascular study  3. Pain Management: Oxycodone and Robaxin as needed. Monitor with increased mobility   -add oxycontin 10mg  q12  -schedule robaxin 4. acute blood loss anemia. Continue iron supplement. Followup CBC  5. Neuropsych: This patient is capable of making decisions on her own behalf.  6. Morbid obesity. BMI 41.15 kg. Discuss weight loss diet dietary consult  7. GERD. Protonix   LOS (Days) 1 A FACE TO FACE EVALUATION WAS PERFORMED  Meredith Staggers 08/06/2013 7:45 AM

## 2013-08-06 NOTE — Progress Notes (Signed)
Social Work Assessment and Plan  Patient Details  Name: [name] MRN: [id number] Date of Birth: Nov 07, 1970  Today's Date: 08/06/2013  Problem List:  Patient Active Problem List   Diagnosis Date Noted  . Status post total bilateral knee replacement 08/05/2013  . Expected blood loss anemia 08/04/2013  . S/P bilateral TKA 08/02/2013  . Morbid obesity 10/12/2010  . Hypertension 10/12/2010  . RUQ PAIN 05/04/2010  . EPIGASTRIC PAIN 05/04/2010   Past Medical History:  Past Medical History  Diagnosis Date  . Arthritis   . Sleep apnea   . Obesity, Class III, BMI 40-49.9 (morbid obesity)   . Heart murmur   . Pneumonia     hx of  . Enlarged thyroid     "all tests are normal"  . GERD (gastroesophageal reflux disease)     with motrin  . Adrenal tumor     "monitoring"  . Anemia     hx of   Past Surgical History:  Past Surgical History  Procedure Laterality Date  . Abdominal hysterectomy  03/2003    partial  . Tubal ligation    . Lipoma excision  01/11/2010  . Cholecystectomy  2012  . Total knee arthroplasty Bilateral 08/02/2013    Procedure: TOTAL KNEE BILATERAL;  Surgeon: Christie Pole, MD;  Location: [facility];  Service: Orthopedics;  Laterality: Bilateral;   Social History:  reports that she has never smoked. She has never used smokeless tobacco. She reports that she does not drink alcohol or use illicit drugs.  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  Children all work.  Spouse and children will take turns sharing caregiver responsibilities Caregiver Availability: 24/7 Family Dynamics: Pt feels at first someone will be with her all the time when she  goes home.  Social History Preferred language: English Religion: Christian Education: college Read: Yes Write: Yes Employment Status: Employed Name of Employer: [employer] of Employment: 21 Return to Work Plans: Plans to return at the start of next school year - August 2015 Legal History/Current Legal Issues: None reported Guardian/Conservator: N/A   Abuse/Neglect Physical Abuse: Denies Verbal Abuse: Denies Sexual Abuse: Denies Exploitation of patient/patient's resources: Denies Self-Neglect: Denies  Emotional Status Pt's affect, behavior and adjustment status: Pt was tearful with CSW but never cried.  She admits to being anxious due to the pain and having some frustration with having to depend on others so much.  Pt does not feel depressed at this time and feels she can manage the anxiety and that it is not keeping her from working with therapists at this time.  CSW to continue to follow pt regarding this. Recent Psychosocial Issues: None reported Pyschiatric History: None reported Substance Abuse History: None reported  Patient / Family Perceptions, Expectations & Goals Pt/Family understanding of illness & functional limitations: Pt/husband have a good understanding of pt's condition and limitations at this time.  They feel their questions have been answered by team. Premorbid pt/family roles/activities: Pt was working full time as a first [employer].  She enjoys crocheting and plans to teach herself to knit while she is recovering from surgery.  Pt enjoys spending time with her family. Anticipated changes in roles/activities/participation: Pt wants to do things for herself, but has  supportive family members to assist her as needed. Pt/family expectations/goals: Pt would like "to be as independent as possible."  [employer]: None Premorbid Home Care/DME Agencies: None ([facility] has already received pt's referral and  will follow her at home.) Transportation available at discharge: husband  Discharge Planning Living Arrangements: Spouse/significant other;Children Support Systems: Spouse/significant other;Children;Friends/neighbors Type of Residence: Private residence Insurance Resources: Multimedia programmer (specify) Printmaker) Financial Resources: Employment Financial Screen Referred: No Living Expenses: Medical laboratory scientific officer Management: Patient;Spouse Does the patient have any problems obtaining your medications?: No Home Management: Pt's husband and children have been helping with this prior to surgery due to pt's knee pain worsening and they can continue to help as needed. Patient/Family Preliminary Plans: Pt to go home with her husband and children to assist. Barriers to Discharge: Steps Social Work Anticipated Follow Up Needs: HH/OP Expected length of stay: 10-14 days  Clinical Impression CSW met with pt and her husband to introduce self and role of CSW and to complete assessment.  Pt was appreciative of CSW visit and was glad that she decided to come (on advice from her surgeon) to CIR.  She wanted to return straight home from surgical admission, but now realizes that she was not ready to go home and needs more therapy and time.  Pt will have great support from her husband and three grown children.  She will have meals from her fellow teacher and co-workers for several weeks and had pre-made and froze several meals already prior to surgery.  Pt wants to be as independent as possible and would like to not have to rely on her family for everything.  Her room is on the first floor.  Arville Go started following pt right after surgery.  Pt has a new CPAP machine after a required sleep study prior to surgery.  No immediate needs at this time, but CSW will continue to follow and order any necessary DME and make other arrangements, as needed, prior to d/c.  CSW will continue to assess and monitor pt for anxiety and  offer stress reduction strategies, as needed, to support pt to be able to maximize her participation with therapies.  [name] 08/06/2013, 8:00 PM

## 2013-08-06 NOTE — Evaluation (Signed)
Physical Therapy Assessment and Plan  Patient Details  Name: [name] MRN: [id number] Date of Birth: 1970-12-06  PT Diagnosis: Abnormal posture, Abnormality of gait, Difficulty walking, Edema, Muscle weakness and Pain in joint Rehab Potential: Good ELOS: 10-14 days   Today's Date: 08/06/2013 Time: 728-828 Time Calculation (min): 60 min  Problem List:  Patient Active Problem List   Diagnosis Date Noted  . Status post total bilateral knee replacement 08/05/2013  . Expected blood loss anemia 08/04/2013  . S/P bilateral TKA 08/02/2013  . Morbid obesity 10/12/2010  . Hypertension 10/12/2010  . RUQ PAIN 05/04/2010  . EPIGASTRIC PAIN 05/04/2010    Past Medical History:  Past Medical History  Diagnosis Date  . Arthritis   . Sleep apnea   . Obesity, Class III, BMI 40-49.9 (morbid obesity)   . Heart murmur   . Pneumonia     hx of  . Enlarged thyroid     "all tests are normal"  . GERD (gastroesophageal reflux disease)     with motrin  . Adrenal tumor     "monitoring"  . Anemia     hx of   Past Surgical History:  Past Surgical History  Procedure Laterality Date  . Abdominal hysterectomy  03/2003    partial  . Tubal ligation    . Lipoma excision  01/11/2010  . Cholecystectomy  2012  . Total knee arthroplasty Bilateral 08/02/2013    Procedure: TOTAL KNEE BILATERAL;  Surgeon: Mauri Pole, MD;  Location: [facility];  Service: Orthopedics;  Laterality: Bilateral;    Assessment & Plan Clinical Impression: Patient is a 43 y.o. year old female with history of sleep apnea, and morbid obesity admitted 08/02/2013 with progressive x2-3 years bilateral knee pain secondary to end-stage osteoarthritis and no relief with conservative care including NSAIDs, corticosteroid injections and activity modification. 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 Underwent bilateral total knee replacement 08/02/2013 per Dr. Alvan Dame. Postoperative pain management. Weightbearing  as tolerated bilateral lower extremities. Placed on Xarelto for DVT prophylaxis. Acute blood loss anemia 10.3 and monitored. Physical therapy and occupational evaluations completed 08/04/2011 with recommendations for physical medicine rehabilitation consult. Patient was admitted for comprehensive rehabilitation program.  Patient transferred to CIR on 08/05/2013 .   Patient currently requires mod A to +2 assist  with mobility secondary to ROM, edema, muscle weakness and muscle joint tightness, decreased cardiorespiratoy endurance and decreased sitting balance, decreased standing balance and decreased balance strategies.  Prior to hospitalization, patient was independent  with mobility and lived with Spouse;Family (husband and youngest son; other children will be available to help as needed) in a House home.  Home access is 4-5 from front entrance - rails on right side, 2-3 from deck - no hand railsStairs to enter.  Patient will benefit from skilled PT intervention to maximize safe functional mobility, minimize fall risk and decrease caregiver burden for planned discharge home with intermittent assist.  Anticipate patient will HHPT v OPPT at discharge.  PT - End of Session Activity Tolerance: Decreased this session PT Assessment Rehab Potential: Good PT Patient demonstrates impairments in the following area(s): Balance;Edema;Endurance;Pain;Safety;Skin Integrity PT Transfers Functional Problem(s): Bed Mobility;Bed to Chair;Car;Furniture PT Locomotion Functional Problem(s): Ambulation;Stairs;Wheelchair Mobility PT Plan PT Intensity: Minimum of 1-2 x/day ,45 to 90 minutes PT Frequency: 5 out of 7 days PT Duration Estimated Length of Stay: 10-14 days PT Treatment/Interventions: Ambulation/gait training;Balance/vestibular training;Community reintegration;Discharge planning;Disease management/prevention;DME/adaptive equipment instruction;Functional mobility training;Neuromuscular re-education;Pain  management;Patient/family education;Psychosocial support;Skin care/wound management;Splinting/orthotics;Stair training;Therapeutic Activities;Therapeutic Exercise;UE/LE  Strength taining/ROM;UE/LE Coordination activities;Wheelchair propulsion/positioning PT Transfers Anticipated Outcome(s): mod I basic transfers; min A car PT Locomotion Anticipated Outcome(s): mod I gait; S stairs PT Recommendation Follow Up Recommendations: Home health PT;Outpatient PT (HHPT v OPPT) Patient destination: Home Equipment Recommended: Rolling walker with 5" wheels (w/c TBD)  Skilled Therapeutic Intervention Individual treatment focused on bed mobility, functional transfers OOB and to BSC with RW (pt with very flexed posture in order to complete sit to stand), short distance gait with RW, and w/c mobility. Pt very anxious throughout session and requires a lot of positive reinforcement and encouragement.  PT Evaluation Precautions/Restrictions Precautions Precautions: Fall;Knee Required Braces or Orthoses:  (has been using KI on L for comfort at times prn) Restrictions Weight Bearing Restrictions: Yes RLE Weight Bearing: Weight bearing as tolerated LLE Weight Bearing: Weight bearing as tolerated Pain Pain Assessment Pain Assessment: 0-10 Pain Score: 7  Pain Type: Surgical pain Pain Location: Knee Pain Orientation: Left;Right Pain Descriptors / Indicators: Throbbing Pain Frequency: Constant Pain Onset: On-going Patients Stated Pain Goal: 3 Pain Intervention(s): RN made aware;Repositioned Multiple Pain Sites: No Ice applied at end of session. Home Living/Prior Functioning Home Living Available Help at Discharge: Family Type of Home: House Home Access: Stairs to enter Entrance Stairs-Number of Steps: 4-5 from front entrance - rails on right side, 2-3 from deck - no hand rails Entrance Stairs-Rails: Right (on front steps; none on back) Home Layout: Two level;Able to live on main level with  bedroom/bathroom  Lives With: Spouse;Family (husband and youngest son; other children will be available to help as needed) Prior Function Level of Independence: Independent with basic ADLs;Independent with homemaking with ambulation;Independent with transfers;Independent with gait  Able to Take Stairs?: Yes (painful prior to surgery) Vocation: Full time employment (Teacher) Leisure: Hobbies-yes (Comment) Comments: crochet, teacher, working out Vision/Perception  Perception Perception: Within Functional Limits Praxis Praxis: Intact  Cognition Overall Cognitive Status: Within Functional Limits for tasks assessed Orientation Level: Oriented X4 Memory: Appears intact Awareness: Appears intact Problem Solving: Appears intact Safety/Judgment: Appears intact Comments: Pt appears very anxious and tearful throughout eval. Encouragement provided Sensation Sensation Light Touch: Appears Intact Proprioception: Appears Intact Additional Comments: BUEs appear intact Coordination Gross Motor Movements are Fluid and Coordinated: Yes Fine Motor Movements are Fluid and Coordinated: Yes Motor  Motor Motor: Within Functional Limits  Locomotion  Ambulation Ambulation/Gait Assistance: 4: Min guard  Trunk/Postural Assessment  Cervical Assessment Cervical Assessment: Within Functional Limits Thoracic Assessment Thoracic Assessment:  (body habitus limiting full assessment; tends to stay flexed ) Lumbar Assessment Lumbar Assessment: Exceptions to WFL (body habitus limiting full assessment) Postural Control Postural Control: Within Functional Limits  Balance Balance Balance Assessed: Yes Static Sitting Balance Static Sitting - Level of Assistance: 6: Modified independent (Device/Increase time) Dynamic Sitting Balance Dynamic Sitting - Level of Assistance: 5: Stand by assistance Static Standing Balance Static Standing - Level of Assistance: 4: Min assist Dynamic Standing Balance Dynamic  Standing - Level of Assistance: 4: Min assist;3: Mod assist Extremity Assessment  RUE Assessment RUE Assessment: Within Functional Limits (can benefit from BUE strengthening) LUE Assessment LUE Assessment: Within Functional Limits (can benefit from BUE strengthening) RLE Assessment RLE Assessment: Exceptions to WFL RLE AROM (degrees) RLE Overall AROM Comments: seated knee flexion 82; edema limiting ROM as well adipose tissue RLE Strength RLE Overall Strength Comments: limited hip strength (2-/5), 3-/5 knee, ankle WFL LLE Assessment LLE Assessment: Exceptions to WFL LLE AROM (degrees) LLE Overall AROM Comments: seated knee flexion 78 degrees; ROM also limited   by edema and adipose tissue LLE Strength LLE Overall Strength Comments: limited hip strength (2-/5), 3-/5 knee, ankle WFL  FIM:  FIM - Bed/Chair Transfer Bed/Chair Transfer Assistive Devices: Walker;Bed rails;Arm rests;HOB elevated Bed/Chair Transfer: 4: Supine > Sit: Min A (steadying Pt. > 75%/lift 1 leg);3: Bed > Chair or W/C: Mod A (lift or lower assist) (bed elevated) FIM - Locomotion: Wheelchair Locomotion: Wheelchair: 2: Travels 50 - 149 ft with supervision, cueing or coaxing FIM - Locomotion: Ambulation Locomotion: Ambulation Assistive Devices: Walker - Rolling Ambulation/Gait Assistance: 4: Min guard Locomotion: Ambulation: 1: Travels less than 50 ft with minimal assistance (Pt.>75%)   Refer to Care Plan for Long Term Goals  Recommendations for other services: None  Discharge Criteria: Patient will be discharged from PT if patient refuses treatment 3 consecutive times without medical reason, if treatment goals not met, if there is a change in medical status, if patient makes no progress towards goals or if patient is discharged from hospital.  The above assessment, treatment plan, treatment alternatives and goals were discussed and mutually agreed upon: by patient  Alison B Gray 08/06/2013, 12:55 PM  

## 2013-08-06 NOTE — Progress Notes (Signed)
Patient had c/o of severe pain this AM. MD added Oxycodone 10mg  q12hr and Robaxin 500mg  q6hr. Patient still receiving Oxy IR 5-20mg . q3hr. Patient received ordered Oxycodone 10mg ., Robaxin 500mg . at scheduled times. Patient also received Oxy IR 10mg  q3hr. and stated that her pain was "beginning to feel more under control." Notified Dan Angiulli, PA-C of patient's pain management progress throughout the day. Continue with plan of care.

## 2013-08-06 NOTE — Progress Notes (Signed)
VASCULAR LAB PRELIMINARY  PRELIMINARY  PRELIMINARY  PRELIMINARY  Bilateral lower extremity venous duplex completed.    Preliminary report:  Bilateral:  No evidence of DVT, superficial thrombosis, or Baker's Cyst.   [place], RVS 08/06/2013, 4:14 PM

## 2013-08-06 NOTE — Progress Notes (Signed)
Pt has home CPAP that she places herself on and off of. Pt aware that if she has questions of needs help to call.

## 2013-08-06 NOTE — Progress Notes (Addendum)
Physical Therapy Session Note  Patient Details  Name: [name] MRN: [id number] Date of Birth: 1971/02/02  Today's Date: 08/06/2013 Time: 1305-1400 Time Calculation (min): 55 min  Short Term Goals: Week 1:  PT Short Term Goal 1 (Week 1): Pt will be able to perform sit <-> supine with min A and use of AE prn PT Short Term Goal 2 (Week 1): Pt will be able to transfer with min A PT Short Term Goal 3 (Week 1): Pt will gait x 50' with S PT Short Term Goal 4 (Week 1): Pt will be able to go up/down 5 steps with R handrail for home entry with min A  Skilled Therapeutic Interventions/Progress Updates:   Pt participated in LE therex group with focus on LE strengthening/ROM to improve functional mobility including AAROM for heel slides using sheet, SAQ, and bed mobility/transfer training with RW. Handout given and left with pt to continue with therapists this weekend for HEP. Pt required overall min A for transfers (tends to flex very far forward with trunk in order to stand). Issued leg lifter for patient to practice with for bed mobility and was able to use successfully to manage LE's.   AAROM knee flexion in supine: R = 94 degrees; L = 84 degrees  Therapy Documentation Precautions:  Precautions Precautions: Fall;Knee Required Braces or Orthoses:  (has been using KI on L for comfort at times prn) Restrictions Weight Bearing Restrictions: Yes RLE Weight Bearing: Weight bearing as tolerated LLE Weight Bearing: Weight bearing as tolerated  Pain: Premedicated and using ice packs for bilateral knee pain.   See FIM for current functional status  Therapy/Group: Group Therapy  Lars Masson 08/06/2013, 2:57 PM

## 2013-08-06 NOTE — Progress Notes (Signed)
Patient information reviewed and entered into eRehab system by Vadhir Mcnay, RN, CRRN, PPS Coordinator.  Information including medical coding and functional independence measure will be reviewed and updated through discharge.    

## 2013-08-06 NOTE — Progress Notes (Signed)
[facility] Individual Statement of Services  Patient Name:  Encarnacion Bole  Date:  08/06/2013  Welcome to the [facility].  Our goal is to provide you with an individualized program based on your diagnosis and situation, designed to meet your specific needs.  With this comprehensive rehabilitation program, you will be expected to participate in at least 3 hours of rehabilitation therapies Monday-Friday, with modified therapy programming on the weekends.  Your rehabilitation program will include the following services:  Physical Therapy (PT), Occupational Therapy (OT), 24 hour per day rehabilitation nursing, Therapeutic Recreaction (TR), Case Management (Social Worker), Rehabilitation Medicine, Nutrition Services and Pharmacy Services  Weekly team conferences will be held on Tuesdays to discuss your progress.  Your Social Worker will talk with you frequently to get your input and to update you on team discussions.  Team conferences with you and your family in attendance may also be held.  Expected length of stay: 10 to 14 days  Overall anticipated outcome:  Modified Independent  Depending on your progress and recovery, your program may change. Your Social Worker will coordinate services and will keep you informed of any changes. Your Social Worker's name and contact numbers are listed  below.  The following services may also be recommended but are not provided by the [facility] will be made to provide these services after discharge if needed.  Arrangements include referral to agencies that provide these services.  Your insurance has been verified to be:  [employer] [place] Your primary doctor is:  Roe Coombs, [place]  Pertinent information will be shared with your doctor  and your insurance company.  Social Worker:  Alfonse Alpers, LCSW  [phone] or [phone]  Information discussed with and copy given to patient by: Silvestre Mesi Chisum Habenicht, 08/06/2013, 7:42 PM

## 2013-08-06 NOTE — IPOC Note (Signed)
Overall Plan of Care [facility]) Patient Details Name: [name] MRN: [id number] DOB: 19-Oct-1970  Admitting Diagnosis: B TKR  Hospital Problems: Active Problems:   Status post total bilateral knee replacement     Functional Problem List: Nursing Edema;Bladder;Bowel;Endurance;Medication Management;Nutrition;Pain;Safety;Skin Integrity  PT Balance;Edema;Endurance;Pain;Safety;Skin Integrity  OT Balance;Behavior;Edema;Endurance;Motor;Pain;Perception;Safety;Sensory;Skin Integrity  SLP    TR         Basic ADL's: OT Grooming;Bathing;Dressing;Toileting     Advanced  ADL's: OT Simple Meal Preparation     Transfers: PT Bed Mobility;Bed to Chair;Car;Furniture  OT Toilet;Tub/Shower     Locomotion: PT Ambulation;Stairs;Wheelchair Mobility     Additional Impairments: OT None  SLP        TR      Anticipated Outcomes Item Anticipated Outcome  Self Feeding independent  Swallowing      Basic self-care  mod I  Toileting  mod I   Bathroom Transfers mod I  Bowel/Bladder  Min A  Transfers  mod I basic transfers; min A car  Locomotion  mod I gait; S stairs  Communication     Cognition     Pain  <1 on a 0-10 scale  Safety/Judgment  Min A   Therapy Plan: PT Intensity: Minimum of 1-2 x/day ,45 to 90 minutes PT Frequency: 5 out of 7 days PT Duration Estimated Length of Stay: 10-14 days OT Intensity: Minimum of 1-2 x/day, 45 to 90 minutes OT Frequency: 5 out of 7 days OT Duration/Estimated Length of Stay: 7-10 days         Team Interventions: Nursing Interventions Patient/Family Education;Disease Management/Prevention;Pain Management;Medication Management;Skin Care/Wound Management;Cognitive Remediation/Compensation;Discharge Planning;Psychosocial Support  PT interventions Ambulation/gait training;Balance/vestibular training;Community reintegration;Discharge planning;Disease management/prevention;DME/adaptive equipment instruction;Functional mobility  training;Neuromuscular re-education;Pain management;Patient/family education;Psychosocial support;Skin care/wound management;Splinting/orthotics;Stair training;Therapeutic Activities;Therapeutic Exercise;UE/LE Strength taining/ROM;UE/LE Coordination activities;Wheelchair propulsion/positioning  OT Interventions Balance/vestibular training;Community reintegration;Discharge planning;DME/adaptive equipment instruction;Functional mobility training;Pain management;Patient/family education;Psychosocial support;Self Care/advanced ADL retraining;Skin care/wound managment;Splinting/orthotics;Therapeutic Activities;Therapeutic Exercise;UE/LE Strength taining/ROM;UE/LE Coordination activities;Wheelchair propulsion/positioning  SLP Interventions    TR Interventions    SW/CM Interventions Discharge Planning;Psychosocial Support;Patient/Family Education    Team Discharge Planning: Destination: PT-Home ,OT- Home , SLP-  Projected Follow-up: PT-Home health PT;Outpatient PT (HHPT v OPPT), OT-  None, SLP-  Projected Equipment Needs: PT-Rolling walker with 5" wheels (w/c TBD), OT- 3 in 1 bedside comode;Tub/shower bench, SLP-  Equipment Details: PT- , OT-  Patient/family involved in discharge planning: PT- Patient,  OT-Patient, SLP-   MD ELOS: 10-12 days Medical Rehab Prognosis:  Excellent Assessment: The patient has been admitted for CIR therapies with the diagnosis of bilateral TKA's. The team will be addressing functional mobility, strength, stamina, balance, safety, adaptive techniques and equipment, self-care, bowel and bladder mgt, patient and caregiver education, pain mgt, knee ROM. Goals have been set at Christie Lope, MD, [facility]      See Team Conference Notes for weekly updates to the plan of care

## 2013-08-06 NOTE — Evaluation (Signed)
Occupational Therapy Assessment and Plan & Session Notes  Patient Details  Name: [name] MRN: [id number] Date of Birth: 22-May-1970  OT Diagnosis: acute pain and muscle weakness (generalized) Rehab Potential: Rehab Potential: Good ELOS: 7-10 days   Today's Date: 08/06/2013  Problem List:  Patient Active Problem List   Diagnosis Date Noted  . Status post total bilateral knee replacement 08/05/2013  . Expected blood loss anemia 08/04/2013  . S/P bilateral TKA 08/02/2013  . Morbid obesity 10/12/2010  . Hypertension 10/12/2010  . RUQ PAIN 05/04/2010  . EPIGASTRIC PAIN 05/04/2010    Past Medical History:  Past Medical History  Diagnosis Date  . Arthritis   . Sleep apnea   . Obesity, Class III, BMI 40-49.9 (morbid obesity)   . Heart murmur   . Pneumonia     hx of  . Enlarged thyroid     "all tests are normal"  . GERD (gastroesophageal reflux disease)     with motrin  . Adrenal tumor     "monitoring"  . Anemia     hx of   Past Surgical History:  Past Surgical History  Procedure Laterality Date  . Abdominal hysterectomy  03/2003    partial  . Tubal ligation    . Lipoma excision  01/11/2010  . Cholecystectomy  2012  . Total knee arthroplasty Bilateral 08/02/2013    Procedure: TOTAL KNEE BILATERAL;  Surgeon: Mauri Pole, MD;  Location: [facility];  Service: Orthopedics;  Laterality: Bilateral;    Assessment & Plan Clinical Impression: [name] is a 43 y.o. right-handed female history of sleep apnea, and morbid obesity admitted 08/02/2013 with progressive x2-3 years bilateral knee pain secondary to end-stage osteoarthritis and no relief with conservative care including NSAIDs, corticosteroid injections and activity modification. 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 Underwent bilateral total knee replacement 08/02/2013 per Dr. Alvan Dame. Postoperative pain management. Weightbearing as tolerated bilateral lower extremities. Placed on Xarelto  for DVT prophylaxis. Acute blood loss anemia 10.3 and monitored. Physical therapy and occupational evaluations completed 08/04/2011 with recommendations for physical medicine rehabilitation consult. Patient was admitted for comprehensive rehabilitation program. Patient transferred to CIR on 08/05/2013 .    Patient currently requires min-mod assist with basic self-care skills (mod +2 for sit>stand from lower surfaces) secondary to muscle weakness and muscle joint tightness, decreased attention/increased anxiety and decreased standing balance, decreased postural control and decreased balance strategies.  Prior to hospitalization, patient could complete ADLs & IADLs independently.   Patient will benefit from skilled intervention to increase independence with basic self-care skills prior to discharge home with care partner.  Anticipate patient will require intermittent supervision and no further OT follow recommended.  OT - End of Session Activity Tolerance: Tolerates 10 - 20 min activity with multiple rests Endurance Deficit: Yes OT Assessment Rehab Potential: Good Barriers to Discharge:  (none known at this time) OT Patient demonstrates impairments in the following area(s): Balance;Behavior;Edema;Endurance;Motor;Pain;Perception;Safety;Sensory;Skin Integrity OT Basic ADL's Functional Problem(s): Grooming;Bathing;Dressing;Toileting OT Advanced ADL's Functional Problem(s): Simple Meal Preparation OT Transfers Functional Problem(s): Toilet;Tub/Shower OT Additional Impairment(s): None OT Plan OT Intensity: Minimum of 1-2 x/day, 45 to 90 minutes OT Frequency: 5 out of 7 days OT Duration/Estimated Length of Stay: 7-10 days OT Treatment/Interventions: Balance/vestibular training;Community reintegration;Discharge planning;DME/adaptive equipment instruction;Functional mobility training;Pain management;Patient/family education;Psychosocial support;Self Care/advanced ADL retraining;Skin care/wound  managment;Splinting/orthotics;Therapeutic Activities;Therapeutic Exercise;UE/LE Strength taining/ROM;UE/LE Coordination activities;Wheelchair propulsion/positioning OT Self Feeding Anticipated Outcome(s): independent OT Basic Self-Care Anticipated Outcome(s): mod I OT Toileting Anticipated Outcome(s): mod I OT Bathroom  Transfers Anticipated Outcome(s): mod I OT Recommendation Patient destination: Home Follow Up Recommendations: None Equipment Recommended: 3 in 1 bedside comode;Tub/shower bench  Precautions/Restrictions  Precautions Precautions: Fall;Knee Required Braces or Orthoses:  (has been using KI on L for comfort at times prn) Restrictions Weight Bearing Restrictions: Yes RLE Weight Bearing: Weight bearing as tolerated LLE Weight Bearing: Weight bearing as tolerated  General Chart Reviewed: Yes Family/Caregiver Present: Yes Pamala Hurry)  Vital Signs Therapy Vitals Temp: 98.1 F (36.7 C) Temp src: Oral Pulse Rate: 71 Resp: 20 BP: 120/58 mmHg (RN Notified) Patient Position (if appropriate): Lying Oxygen Therapy SpO2: 100 % O2 Device: None (Room air)  Pain Pain Assessment Pain Assessment: 0-10 Pain Score: 7  Pain Type: Surgical pain Pain Location: Knee Pain Orientation: Left;Right Pain Descriptors / Indicators: Throbbing Pain Frequency: Constant Pain Onset: On-going Patients Stated Pain Goal: 3 Pain Intervention(s): RN made aware;Repositioned Multiple Pain Sites: No  Home Living/Prior Functioning Home Living Available Help at Discharge: Family Type of Home: House Home Access: Stairs to enter [employer] of Steps: 4-5 from front entrance - rails on right side, 2-3 from deck - no hand rails Entrance Stairs-Rails: Right (on front steps; none on back) Home Layout: Two level;Able to live on main level with bedroom/bathroom  Lives With: Spouse;Family (husband and youngest son; other children will be available to help as needed) IADL History Homemaking  Responsibilities: Yes Meal Prep Responsibility: Secondary Laundry Responsibility: Secondary Cleaning Responsibility: Secondary Bill Paying/Finance Responsibility: Secondary Shopping Responsibility: Secondary Child Care Responsibility: No Current License: Yes Occupation: Full time employment 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 Prior Function Level of Independence: Independent with basic ADLs;Independent with homemaking with ambulation;Independent with transfers;Independent with gait  Able to Take Stairs?: Yes (painful prior to surgery) 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) Leisure: Hobbies-yes (Comment) Comments: crochet, teacher, working out  ADL - See FIM  Vision/Perception  Vision- History Baseline Vision/History: Wears glasses Wears Glasses: Distance only Patient Visual Report: No change from baseline Vision- Assessment Vision Assessment?: No apparent visual deficits Perception Perception: Within Functional Limits Praxis Praxis: Intact   Cognition Overall Cognitive Status: Within Functional Limits for tasks assessed Orientation Level: Oriented X4 Memory: Appears intact Awareness: Appears intact Problem Solving: Appears intact Safety/Judgment: Appears intact Comments: Patient with increased anxiety during evaluation. Therapist provided encouragement and psychosocial support.   Sensation Sensation Light Touch: Appears Intact Proprioception: Appears Intact Additional Comments: BUEs appear intact Coordination Gross Motor Movements are Fluid and Coordinated: Yes Fine Motor Movements are Fluid and Coordinated: Yes  Motor  Motor Motor: Within Functional Limits  Trunk/Postural Assessment  Cervical Assessment Cervical Assessment: Within Functional Limits Thoracic Assessment Thoracic Assessment:  (body habitus limiting full assessment; tends to stay flexed ) Lumbar Assessment Lumbar Assessment: Exceptions to [facility] (body habitus limiting full  assessment) Postural Control Postural Control: Within Functional Limits   Balance Balance Balance Assessed: Yes Static Sitting Balance Static Sitting - Level of Assistance: 6: Modified independent (Device/Increase time) Dynamic Sitting Balance Dynamic Sitting - Level of Assistance: 5: Stand by assistance Static Standing Balance Static Standing - Level of Assistance: 4: Min assist Dynamic Standing Balance Dynamic Standing - Level of Assistance: 4: Min assist;3: Mod assist  Extremity/Trunk Assessment RUE Assessment RUE Assessment: Within Functional Limits (can benefit from BUE strengthening) LUE Assessment LUE Assessment: Within Functional Limits (can benefit from BUE strengthening)  FIM:  FIM - Eating Eating Activity: 7: Complete independence:no helper FIM - Grooming Grooming Steps: Wash, rinse, dry face;Wash, rinse, dry hands;Oral care, brush teeth, clean dentures;Brush,  comb hair Grooming: 5: Set-up assist to obtain items FIM - Bathing Bathing Steps Patient Completed: Chest;Right Arm;Left Arm;Abdomen;Front perineal area;Right upper leg;Left upper leg Bathing: 3: Mod-Patient completes 5-7 67f 10 parts or 50-74% FIM - Upper Body Dressing/Undressing Upper body dressing/undressing steps patient completed: Thread/unthread right bra strap;Thread/unthread left bra strap;Thread/unthread right sleeve of pullover shirt/dresss;Thread/unthread left sleeve of pullover shirt/dress;Put head through opening of pull over shirt/dress;Pull shirt over trunk Upper body dressing/undressing: 4: Min-Patient completed 75 plus % of tasks FIM - Lower Body Dressing/Undressing Lower body dressing/undressing steps patient completed: Thread/unthread right underwear leg;Thread/unthread left underwear leg;Thread/unthread right pants leg;Thread/unthread left pants leg;Don/Doff right sock;Don/Doff left sock Lower body dressing/undressing: 4: Min-Patient completed 75 plus % of tasks FIM - Toileting Toileting: 0:  Activity did not occur FIM - Control and instrumentation engineer Devices: Walker;Bed rails;Arm rests;HOB elevated Bed/Chair Transfer: 4: Chair or W/C > Bed: Min A (steadying Pt. > 75%);3: Sit > Supine: Mod A (lifting assist/Pt. 50-74%/lift 2 legs) FIM - Radio producer Devices: Bedside commode;Walker Toilet Transfers: 3-To toilet/BSC: Mod A (lift or lower assist);3-From toilet/BSC: Mod A (lift or lower assist) FIM - Tub/Shower Transfers Tub/shower Transfers: 0-Activity did not occur or was simulated   Refer to Care Plan for Long Term Goals  Recommendations for other services: None  Discharge Criteria: Patient will be discharged from OT if patient refuses treatment 3 consecutive times without medical reason, if treatment goals not met, if there is a change in medical status, if patient makes no progress towards goals or if patient is discharged from hospital.  The above assessment, treatment plan, treatment alternatives and goals were discussed and mutually agreed upon: by patient  ------------------------------------------------------------------------------------------------------------------------------------  SESSION NOTES   Session #1 [phone] - 60 Minutes Individual Therapy Patient with 7/10 complaints of pain, RN aware Initial 1:1 occupational therapy evaluation completed. Focused skilled intervention on w/c mobility, sit<>stands, UB/LB bathing & dressing, w/c > bed transfer, sit>supine, and overall activity tolerance/endurance. Patient with increased anxiety this session and poor attention to task. Patient's daughter present, providing support. At end of session, left patient supine in bed with all needs within reach and ice packs on bilateral knees. Notified RN of needed TEDs > BLEs, recommending XL TEDs.   Session #2 [id number] - 28 Minutes Individual Therapy Patient with complaints of bilateral knee pain - no rate given, RN  aware Patient received seated in w/c with husband present at bedside. Patient propelled self from room > ADL apartment. Focused on functional mobility/ambulation using RW and tub/shower transfer on/off tub transfer bench. Patient required moderate assistance into tub (requring assistance with BLEs) and mod assist +2 out of tub secondary to sit>stand from low surface. Patient ambulated back to w/c and propelled self back to room. Once in room patient with request to use bathroom. Therapist recommending patient ambulate <>bathroom with staff when needing to toilet. Patient ambulated > drop arm BSC with minimal assistance and therapist left patient seated on BSC over toilet with husband present. Patient aware to call staff when finished and not to get up in room without staff present.   [name] 08/06/2013, 9:54 AM

## 2013-08-07 ENCOUNTER — Inpatient Hospital Stay (HOSPITAL_COMMUNITY): Payer: BC Managed Care – PPO

## 2013-08-07 DIAGNOSIS — I10 Essential (primary) hypertension: Secondary | ICD-10-CM

## 2013-08-07 DIAGNOSIS — Z96659 Presence of unspecified artificial knee joint: Secondary | ICD-10-CM

## 2013-08-07 NOTE — Progress Notes (Signed)
Patient ID: [name], female   DOB: 1971-01-22, 43 y.o.   MRN: [id number]   [place] [facility] REH[facility]ILITATION     PROGRESS NOTE   08/07/13.  Subjective/Complaints:  43 year old patient who has a history of hypertension or obesity, who is admitted for CIR with functional deficits secondary to OA of knees s/p bilateral TKA's. Patient is now 5 days postop and still having considerable knee discomfort  Past Medical History  Diagnosis Date  . Arthritis   . Sleep apnea   . Obesity, Class III, BMI 40-49.9 (morbid obesity)   . Heart murmur   . Pneumonia     hx of  . Enlarged thyroid     "all tests are normal"  . GERD (gastroesophageal reflux disease)     with motrin  . Adrenal tumor     "monitoring"  . Anemia     hx of    Patient Vitals for the past 24 hrs:  BP Temp Temp src Pulse Resp SpO2  08/07/13 0457 113/49 mmHg 98.6 F (37 C) Oral 64 18 99 %  08/06/13 2058 116/61 mmHg 98.2 F (36.8 C) Oral 85 18 99 %  08/06/13 1500 124/59 mmHg 98.5 F (36.9 C) Oral 73 20 99 %     Intake/Output Summary (Last 24 hours) at 08/07/13 0948 Last data filed at 08/06/13 1826  Gross per 24 hour  Intake    720 ml  Output      0 ml  Net    720 ml     Objective: Vital Signs: Blood pressure 113/49, pulse 64, temperature 98.6 F (37 C), temperature source Oral, resp. rate 18, height 5\' 10"  (1.778 m), weight 310 lb 3 oz (140.7 kg), SpO2 99.00%. No results found.  Recent Labs  08/06/13 0900  WBC 9.6  HGB 10.7*  HCT 32.2*  PLT 273    Recent Labs  08/06/13 0900  NA 137  K 3.6*  CL 99  GLUCOSE 143*  BUN 8  CREATININE 0.49*  CALCIUM 8.3*   CBG (last 3)  No results found for this basename: GLUCAP,  in the last 72 hours  Wt Readings from Last 3 Encounters:  08/05/13 310 lb 3 oz (140.7 kg)  08/02/13 291 lb 0.1 oz (131.999 kg)  08/02/13 291 lb 0.1 oz (131.999 kg)    Physical Exam:  Constitutional: She is oriented to person, place, and time.  HENT:   negative  Neck: Normal range of motion. Neck supple. No thyromegaly present.  Cardiovascular: Normal rate and regular rhythm.  Respiratory: Effort normal and breath sounds normal. No respiratory distress.  GI: Soft. Bowel sounds are normal. She exhibits no distension.  Obese  Neurological: She is alert and oriented to person, place, and time.  Skin:  Bilateral knee incisions are dressed and appropriately tender. No erythema. Minimal dressingDorsi flexion plantar flexion intact  and ice packs in place.       Assessment/Plan: 1. Functional deficits secondary to OA of knees s/p bilateral TKA's which require 3+ hours per day of interdisciplinary therapy in a comprehensive inpatient rehab setting. 2. DVT Prophylaxis/Anticoagulation: Xarelto. Monitor for bleeding episodes. Check vascular study  3. Pain Management: Oxycodone and Robaxin as needed. Monitor with increased mobility   -add oxycontin 10mg  q12  -schedule robaxin 4. acute blood loss anemia. Continue iron supplement. Followup CBC   5. Morbid obesity. BMI 41.15 kg. Discuss weight loss diet dietary consult    LOS (Days) 2 A FACE TO FACE EVALUATION WAS PERFORMED  Marletta Lor 08/07/2013 9:46 AM

## 2013-08-07 NOTE — Progress Notes (Signed)
Physical Therapy Session Note  Patient Details  Name: [name] MRN: [id number] Date of Birth: 22-Sep-1970  Today's Date: 08/07/2013 Time: 1000-1100 Time Calculation (min): 60 min  Short Term Goals: Week 1:  PT Short Term Goal 1 (Week 1): Pt will be able to perform sit <-> supine with min A and use of AE prn PT Short Term Goal 2 (Week 1): Pt will be able to transfer with min A PT Short Term Goal 3 (Week 1): Pt will gait x 50' with S PT Short Term Goal 4 (Week 1): Pt will be able to go up/down 5 steps with R handrail for home entry with min A  Skilled Therapeutic Interventions/Progress Updates:  Pt received sitting in w/c attempting to don shoes upon PT entering room. Pt agreeable to therapy. Pt able to complete donning of B shoes and reports increased pain with task but very determined to complete independently. Pt propelled wheel chair in hallways x160 feet using BUE at supervision level and cues for safety. Session focused on sit to stand/stand to sit transfers and gait activities. Nsg notified of pt's increased pain (8/10, L>R) and meds given during session. Pt able to complete sit to stand/stand to sit transfers from w/c and mat table surfaces with min A and cues for technique to walk BLE in/out to control ROM and pain in BLE. Pt ambulated 25 feet x1 and 55 feet x1 with RW and min A, cues to decreased stride length and RW safety. Pt required a lengthy rest break after each trial and cues to promote pursed lip breathing. Pt instructed in step negotiation, completing 3 steps with B rails, requiring min A and cues for sequencing and safety. Pt returned to room and assisted into bed to elevated BLE for edema reduction and pain control. Pt able to lift BLE into bed with leg lifter at supervision level. Ice placed to B knees and pt positioned comfortably in supine. Dtr present throughout session. All needs met and within reach at completion of session.   Therapy Documentation Precautions:   Precautions Precautions: Fall;Knee Required Braces or Orthoses:  (has been using KI on L for comfort at times prn) Restrictions Weight Bearing Restrictions: Yes RLE Weight Bearing: Weight bearing as tolerated LLE Weight Bearing: Weight bearing as tolerated    Pain: Pain Assessment Pain Assessment: 0-10 Pain Score: 5  Pain Type: Surgical pain Pain Location: Knee Pain Orientation: Right;Left Pain Descriptors / Indicators: Aching Pain Onset: With Activity Patients Stated Pain Goal: 2 Pain Intervention(s): Medication (See eMAR)  See FIM for current functional status  Therapy/Group: Individual Therapy  Dorethia Jeanmarie R Dawon Troop 08/07/2013, 12:17 PM

## 2013-08-08 ENCOUNTER — Inpatient Hospital Stay (HOSPITAL_COMMUNITY): Payer: BC Managed Care – PPO | Admitting: Physical Therapy

## 2013-08-08 ENCOUNTER — Inpatient Hospital Stay (HOSPITAL_COMMUNITY): Payer: BC Managed Care – PPO | Admitting: *Deleted

## 2013-08-08 NOTE — Progress Notes (Signed)
Occupational Therapy Session Note  Patient Details  Name: [name] MRN: [id number] Date of Birth: 1971-01-30  Today's Date: 08/08/2013 Time:  -    1010-1110   1st session    Short Term Goals: Week 1:  OT Short Term Goal 1 (Week 1): Short Term Goals = Long Term Goals due to ELOS  Skilled Therapeutic Interventions/Progress Updates:    1st session:  Ambulated to shower and tranfered to 3n1 commode chair (22 inches height) with verbal cues and SBA.  Pt. Leaned over to wash peri area.  Ambulated out to recliner and dressed with SEt up assist.  Pain= 8/10 B Knees.  Asked  for ice packs at end of session for pain in knees.    Time:  1530-1615  (45 min)  2nd session Pain: 10/10 Individual session Addressed functional mobility, transfers, sit to stand and stand to sit.  Ambulated with RW to shower area in room.  Adjusted tub bench to 20.5 inches height.  (Weight capacity jon bench is 300#).  Pt weighs 280.  Practiced getting up and down from the bench with grab bar on left.  Pt. Was SBA with increased time and talking through the steps to sequence .  Pt. Ambulated back to bed and was SBA to get in bed with leg lifter.  She doffed ted hose with no help.  Provided ice packs for knees at end of session.    Therapy Documentation Precautions:  Precautions Precautions: Fall;Knee Required Braces or Orthoses:  (has been using KI on L for comfort at times prn) Restrictions Weight Bearing Restrictions: Yes RLE Weight Bearing: Weight bearing as tolerated LLE Weight Bearing: Weight bearing as tolerated      Pain:8/10  Bilateral knees (1st session)      See FIM for current functional status  Therapy/Group: Individual Therapy  Lisa Roca 08/08/2013, 11:08 AM

## 2013-08-08 NOTE — Progress Notes (Signed)
Patient ID: [name], female   DOB: [birth year], 43 y.o.   MRN: [id number]  Patient ID: [name], female   DOB: Nov 27, 1970, 43 y.o.   MRN: [id number]   [place] [facility] MEDIC[facility] & REHABILITATION     PROGRESS NOTE   08/08/13.  Subjective/Complaints:  43 year old patient who has a history of hypertension and morbid obesity, who is admitted for CIR with functional deficits secondary to OA of knees s/p bilateral TKA's. Patient is now 6 days postop and still having considerable knee discomfort  Past Medical History  Diagnosis Date  . Arthritis   . Sleep apnea   . Obesity, Class III, BMI 40-49.9 (morbid obesity)   . Heart murmur   . Pneumonia     hx of  . Enlarged thyroid     "all tests are normal"  . GERD (gastroesophageal reflux disease)     with motrin  . Adrenal tumor     "monitoring"  . Anemia     hx of    Patient Vitals for the past 24 hrs:  BP Temp Temp src Pulse Resp SpO2  08/08/13 0458 119/69 mmHg 98 F (36.7 C) Oral 81 20 100 %  08/07/13 2207 122/76 mmHg 98.1 F (36.7 C) Oral 83 16 100 %  08/07/13 1455 108/51 mmHg 98.7 F (37.1 C) Oral 80 20 100 %     Intake/Output Summary (Last 24 hours) at 08/08/13 0913 Last data filed at 08/07/13 1800  Gross per 24 hour  Intake    480 ml  Output      0 ml  Net    480 ml     Objective: Vital Signs: Blood pressure 119/69, pulse 81, temperature 98 F (36.7 C), temperature source Oral, resp. rate 20, height 5\' 10"  (1.778 m), weight 310 lb 3 oz (140.7 kg), SpO2 100.00%. No results found.  Recent Labs  08/06/13 0900  WBC 9.6  HGB 10.7*  HCT 32.2*  PLT 273    Recent Labs  08/06/13 0900  NA 137  K 3.6*  CL 99  GLUCOSE 143*  BUN 8  CREATININE 0.49*  CALCIUM 8.3*   CBG (last 3)  No results found for this basename: GLUCAP,  in the last 72 hours  Wt Readings from Last 3 Encounters:  08/05/13 310 lb 3 oz (140.7 kg)  08/02/13 291 lb 0.1 oz (131.999 kg)  08/02/13 291 lb 0.1 oz (131.999 kg)     Physical Exam:  Constitutional: She is oriented to person, place, and time.  HENT:  negative  Neck: Normal range of motion. Neck supple. No thyromegaly present.  Cardiovascular: Normal rate and regular rhythm.  Respiratory: Effort normal and breath sounds normal. No respiratory distress.  GI: Soft. Bowel sounds are normal. She exhibits no distension.  Obese  Neurological: She is alert and oriented to person, place, and time.  Skin:  Bilateral knee incisions are dressed and appropriately tender. No erythema. Minimal dressings     Assessment/Plan: 1. Functional deficits secondary to OA of knees s/p bilateral TKA's which require 3+ hours per day of interdisciplinary therapy in a comprehensive inpatient rehab setting. 2. DVT Prophylaxis/Anticoagulation: Xarelto. Monitor for bleeding episodes. Check vascular study  3. Pain Management: Oxycodone and Robaxin as needed. Monitor with increased mobility   -add oxycontin 10mg  q12  -schedule robaxin 4. acute blood loss anemia. Continue iron supplement. Followup CBC   5. Morbid obesity. BMI 41.15 kg. Discuss weight loss diet dietary consult    LOS (Days) 3 A FACE  TO [facility] EVALUATION WAS PERFORMED  Marletta Lor 08/08/2013 9:13 AM

## 2013-08-08 NOTE — Progress Notes (Signed)
Patient places self on CPAP home unit. NO assistance needed, but aware of help available if needed.

## 2013-08-08 NOTE — Progress Notes (Signed)
Physical Therapy Session Note  Patient Details  Name: [name] MRN: [id number] Date of Birth: 1970/11/05  Today's Date: 08/08/2013 Time: 0900-1000 and 1350-1430 Time Calculation (min): 60 min and 40 min  Short Term Goals: Week 1:  PT Short Term Goal 1 (Week 1): Pt will be able to perform sit <-> supine with min A and use of AE prn PT Short Term Goal 2 (Week 1): Pt will be able to transfer with min A PT Short Term Goal 3 (Week 1): Pt will gait x 50' with S PT Short Term Goal 4 (Week 1): Pt will be able to go up/down 5 steps with R handrail for home entry with min A  Skilled Therapeutic Interventions/Progress Updates:   AM Session: Pt received sitting in w/c, requesting to don pants/shoes before therapy. Supervision for LB dressing from w/c level and total A to don shoes. Pt with 8/10 pain at rest, RN present for pain medication administration. W/c propulsion using BUEs >150 ft x 2 with supervision. Gait training 90 ft using RW and supervision, vc's for safety/technique. Pt with increased trunk forward flexion, step-to pattern, B circumduction with trunk rotation, lacking B knee flex. Sit <> supine on mat with supervision and use of leg lifter. Supine on mat with wedge behind pt for comfort, pt performed LE therex for strengthening/ROM: ankle pumps, quad sets, heel slides with maxislide, and SAQ over bolster, 2-3 x 10 each LE. Sit > stand from mat at 23 inch height with multiple attempts and vc's for technique. Pt returned to room and left sitting in w/c with all needs within reach. Pt with 7-8/10 pain with mobility during session, repositioned for comfort.   PM Session: Pt received sitting in w/c, family present for session. W/c propulsion using BUEs x 150 ft mod I. Gait training in controlled environment x 90 ft using RW with supervision with improved step-through pattern and upright posture and decreased circumduction, able to initiate knee flexion with cues. Pt performed remainder of HEP for  LE strengthening/ROM: supine hip abduction with maxislide to fatigue, active assisted SLR x 10 each LE, seated EOM active assisted knee flexion with pillowcase under LE to decrease friction, x 15 each LE. Pt left sitting in w/c to return to room with husband.   Therapy Documentation Precautions:  Precautions Precautions: Fall;Knee Required Braces or Orthoses:  (has been using KI on L for comfort at times prn) Restrictions Weight Bearing Restrictions: Yes RLE Weight Bearing: Weight bearing as tolerated LLE Weight Bearing: Weight bearing as tolerated Pain: Pain Assessment Pain Assessment: 0-10 Pain Score: 7  Pain Type: Surgical pain Pain Location: Knee Pain Orientation: Right;Left Pain Descriptors / Indicators: Other (Comment) (deep pain) Pain Onset: Gradual Pain Intervention(s): Medication (See eMAR)    See FIM for current functional status  Therapy/Group: Individual Therapy  Laretta Alstrom 08/08/2013, 12:35 PM

## 2013-08-09 ENCOUNTER — Inpatient Hospital Stay (HOSPITAL_COMMUNITY): Payer: BC Managed Care – PPO | Admitting: Occupational Therapy

## 2013-08-09 ENCOUNTER — Inpatient Hospital Stay (HOSPITAL_COMMUNITY): Payer: BC Managed Care – PPO

## 2013-08-09 ENCOUNTER — Encounter (HOSPITAL_COMMUNITY): Payer: BC Managed Care – PPO | Admitting: Occupational Therapy

## 2013-08-09 ENCOUNTER — Inpatient Hospital Stay (HOSPITAL_COMMUNITY): Payer: BC Managed Care – PPO | Admitting: Physical Therapy

## 2013-08-09 MED ORDER — MORPHINE SULFATE ER 15 MG PO TBCR
15.0000 mg | EXTENDED_RELEASE_TABLET | Freq: Two times a day (BID) | ORAL | Status: DC
  Administered 2013-08-09 – 2013-08-13 (×8): 15 mg via ORAL
  Filled 2013-08-09 (×8): qty 1

## 2013-08-09 NOTE — Progress Notes (Signed)
[facility] PHYSICAL MEDICINE & REHABILITATION     PROGRESS NOTE    Subjective/Complaints: Thinks pain medication is making her a bit loopy. Having some cramps/spasms in legs/feet  Objective: Vital Signs: Blood pressure 116/65, pulse 77, temperature 97.9 F (36.6 C), temperature source Oral, resp. rate 16, height 5\' 10"  (1.778 m), weight 140.7 kg (310 lb 3 oz), SpO2 100.00%. No results found. No results found for this basename: WBC, HGB, HCT, PLT,  in the last 72 hours No results found for this basename: NA, K, CL, CO, GLUCOSE, BUN, CREATININE, CALCIUM,  in the last 72 hours CBG (last 3)  No results found for this basename: GLUCAP,  in the last 72 hours  Wt Readings from Last 3 Encounters:  08/05/13 140.7 kg (310 lb 3 oz)  08/02/13 131.999 kg (291 lb 0.1 oz)  08/02/13 131.999 kg (291 lb 0.1 oz)    Physical Exam:  Constitutional: She is oriented to person, place, and time.  HENT:  Head: Normocephalic.  Eyes: EOM are normal.  Neck: Normal range of motion. Neck supple. No thyromegaly present.  Cardiovascular: Normal rate and regular rhythm.  Respiratory: Effort normal and breath sounds normal. No respiratory distress.  GI: Soft. Bowel sounds are normal. She exhibits no distension.  Obese  Neurological: She is alert and oriented to person, place, and time.  Skin:  Bilateral knee incisions are dressed and appropriately tender. No erythema. Minimal dressingDorsi flexion plantar flexion intact  Motor strength is 5/5 bilateral deltoid, bicep, tricep, grip 2 minus hip flexion trace knee extension 4/5 ankle dorsiflexion plantar flexion  Sensory intact to light touch in bilateral upper and lower limbs  Musc: edema around knees, appropriate. Knees tender to palpation, ROM Psych: a little anxious  Assessment/Plan: 1. Functional deficits secondary to OA of knees s/p bilateral TKA's which require 3+ hours per day of interdisciplinary therapy in a comprehensive inpatient rehab  setting. Physiatrist is providing close team supervision and 24 hour management of active medical problems listed below. Physiatrist and rehab team continue to assess barriers to discharge/monitor patient progress toward functional and medical goals.  FIM: FIM - Bathing Bathing Steps Patient Completed: Chest;Right Arm;Left Arm;Abdomen;Front perineal area;Right upper leg;Left upper leg;Buttocks Bathing: 4: Min-Patient completes 8-9 37f 10 parts or 75+ percent  FIM - Upper Body Dressing/Undressing Upper body dressing/undressing steps patient completed: Thread/unthread right bra strap;Thread/unthread left bra strap;Thread/unthread right sleeve of pullover shirt/dresss;Thread/unthread left sleeve of pullover shirt/dress;Put head through opening of pull over shirt/dress;Pull shirt over trunk Upper body dressing/undressing: 5: Set-up assist to: Obtain clothing/put away FIM - Lower Body Dressing/Undressing Lower body dressing/undressing steps patient completed: Thread/unthread right underwear leg;Thread/unthread left underwear leg;Thread/unthread right pants leg;Thread/unthread left pants leg;Don/Doff right sock;Don/Doff left sock;Pull underwear up/down;Pull pants up/down;Don/Doff right shoe;Don/Doff left shoe Lower body dressing/undressing: 4: Steadying Assist  FIM - Toileting Toileting steps completed by patient: Adjust clothing after toileting Toileting: 0: Activity did not occur  FIM - Radio producer Devices: Recruitment consultant Transfers: 0-Activity did not occur  FIM - Control and instrumentation engineer Devices: Copy: 4: Supine > Sit: Min A (steadying Pt. > 75%/lift 1 leg)  FIM - Locomotion: Wheelchair Distance: 150 Locomotion: Wheelchair: 5: Travels 150 ft or more: maneuvers on rugs and over door sills with supervision, cueing or coaxing FIM - Locomotion: Ambulation Locomotion: Ambulation Assistive Devices: Astronomer Ambulation/Gait Assistance: 5: Supervision Locomotion: Ambulation: 2: Travels 50 - 149 ft with supervision/safety issues  Comprehension Comprehension Mode: Auditory Comprehension:  5-Follows basic conversation/direction: With no assist  Expression Expression Mode: Verbal Expression: 5-Expresses basic needs/ideas: With no assist  Social Interaction Social Interaction: 7-Interacts appropriately with others - No medications needed.  Problem Solving Problem Solving: 7-Solves complex problems: Recognizes & self-corrects  Memory Memory: 7-Complete Independence: No helper  Medical Problem List and Plan:  1. 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 DVT Prophylaxis/Anticoagulation: Xarelto. Monitor for bleeding episodes. Check vascular study  3. Pain Management: Oxycodone and Robaxin as needed. Monitor with increased mobility   -will replace oxycontin with ms contin to see if this better tolerated  -schedule robaxin 4. acute blood loss anemia. Continue iron supplement. Followup CBC  5. Neuropsych: This patient is capable of making decisions on her own behalf.  6. Morbid obesity. BMI 41.15 kg. Discuss weight loss diet dietary consult  7. GERD. Protonix   LOS (Days) 4 A FACE TO FACE EVALUATION WAS PERFORMED  Meredith Staggers 08/09/2013 9:07 AM

## 2013-08-09 NOTE — Progress Notes (Signed)
Physical Therapy Session Note  Patient Details  Name: [name] MRN: [id number] Date of Birth: 1970/08/09  Today's Date: 08/09/2013 Time: 6389-3734 Time Calculation (min): 60 min  Short Term Goals: Week 1:  PT Short Term Goal 1 (Week 1): Pt will be able to perform sit <-> supine with min A and use of AE prn PT Short Term Goal 2 (Week 1): Pt will be able to transfer with min A PT Short Term Goal 3 (Week 1): Pt will gait x 50' with S PT Short Term Goal 4 (Week 1): Pt will be able to go up/down 5 steps with R handrail for home entry with min A  Skilled Therapeutic Interventions/Progress Updates:   Pt already in w/c; son present.  Pt performed w/c mobility x 150' and parts management to set up for transfers with supervision.  Pt performed all transfers w/c <> mat stand pivot with RW and min A and sit<> supine on mat with use of leg lifter with supervision.  On mat performed bilat LE PROM and strengthening exercises with pt performing self stretch of bilat hamstring and gastroc muscles in supine with the use of the leg lifter x 30 sec each side; also performed 10-12 reps R and LLE glute/quad sets, heel slides, hip ABD and ankle pumps.  Discussed with pt stair set up at each entrance and discussed pros and cons of each set up.  Demonstrated to pt how to perform stair negotiation with RW if rail not present. Performed stair negotiation training up/down 3 short steps first with R rail and min A with verbal cues for sequence and then again up/down 3 stairs backwards to ascend, forwards to descend with UE support on RW and min A.  Pt to have husband take a picture of each entrance to help with problem solving and practicing specific set up.  Returned to room and to recliner with supervision.  Pt reporting 8/10 pain at end of session; RN alerted and cold gel packs placed on each knee.     Therapy Documentation Precautions:  Precautions Precautions: Fall;Knee Required Braces or Orthoses:  (has been  using KI on L for comfort at times prn) Restrictions Weight Bearing Restrictions: Yes RLE Weight Bearing: Weight bearing as tolerated LLE Weight Bearing: Weight bearing as tolerated Pain: Pain Assessment Pain Assessment: 0-10 Pain Score: 8  Pain Type: Surgical pain Pain Location: Knee Pain Orientation: Right;Left Pain Descriptors / Indicators: Aching Pain Frequency: Intermittent Pain Onset: On-going Patients Stated Pain Goal: 4 Pain Intervention(s): Medication (See eMAR) Locomotion : Ambulation Ambulation/Gait Assistance: 5: Supervision Wheelchair Mobility Distance: 150   See FIM for current functional status  Therapy/Group: Individual Therapy  Malachy Mood 08/09/2013, 11:05 AM

## 2013-08-09 NOTE — Progress Notes (Signed)
Physical Therapy Session Note  Patient Details  Name: [name] MRN: [id number] Date of Birth: Aug 05, 1970  Today's Date: 08/09/2013 Time: 1420-1505 Time Calculation (min): 45 min  Short Term Goals: Week 1:  PT Short Term Goal 1 (Week 1): Pt will be able to perform sit <-> supine with min A and use of AE prn PT Short Term Goal 2 (Week 1): Pt will be able to transfer with min A PT Short Term Goal 3 (Week 1): Pt will gait x 50' with S PT Short Term Goal 4 (Week 1): Pt will be able to go up/down 5 steps with R handrail for home entry with min A  Skilled Therapeutic Interventions/Progress Updates:   Session focused on family education with pt's husband in regards to basic transfers and toilet transfers (pt approaching mod I) and checked off on safety plan to assist pt, bed mobility on high bed with step to simulate home environment (pt's husband plan to purchase small step to make this doable for pt at home), stair negotiation on 6-7" step with R rail only up/down 2 steps x 2 reps to simulate home entry and min A. Discussed possible d/c end of week if rest of team agrees and pt excited and ready. Plan to address car transfer tomorrow. Pt able to gait with RW with overall S (cues for increased knee flexion for more normal gait pattern) x 90'. Ice packs applied to B knees end of session.   Therapy Documentation Precautions:  Precautions Precautions: Fall;Knee Required Braces or Orthoses:  (has been using KI on L for comfort at times prn) Restrictions Weight Bearing Restrictions: Yes RLE Weight Bearing: Weight bearing as tolerated LLE Weight Bearing: Weight bearing as tolerated  Pain: Premedicated and using ice pack at end of session for pain and edema control.   See FIM for current functional status  Therapy/Group: Individual Therapy  Lars Masson 08/09/2013, 4:13 PM

## 2013-08-09 NOTE — Progress Notes (Signed)
Pt places self on home CPAP. Pt aware that if she needs help to call.

## 2013-08-09 NOTE — Progress Notes (Signed)
Occupational Therapy Session Notes  Patient Details  Name: [name] MRN: [id number] Date of Birth: [birth year]  Today's Date: 08/09/2013  Short Term Goals: Week 1:  OT Short Term Goal 1 (Week 1): Short Term Goals = Long Term Goals due to ELOS  Skilled Therapeutic Interventions/Progress Updates:   Session #1 [phone] - 57 Minutes Individual Therapy Patient with complaints of 7/10 pain in BLEs, RN aware and present for medication management.  Patient received engaging in bed mobility with NT present, therapist took over from here. Patient sat EOB, stood with RW, and ambulated into bathroom with close supervision. Patient performed toilet transfer & toileting with supervision. Walk-in shower transfer completed with supervision onto tub transfer bench. UB/LB bathing in seated position(lateral leans for peri cleansing) completed with supervision. Patient also completed UB/LB dressing in bathroom in sit<>stand position. Patient ambulated back to bed, laid in supine with supervision using leg lifter and donned TEDs with set-up assistance. Patient transferred back to w/c with supervision using RW and left seated in w/c with all needs within reach. Patient's son present during session.   Session #2 [id number] - 30 Minutes Individual Therapy Patient with complaints of pain, no rate given - RN aware Patient received in bathroom on BSC. Patient able to perform transfer with supervision and toileting aspects at mod I level. From here, patient ambulated > sink for grooming tasks of washing hands. Patient then ambulated down hallway, ~25 feet before needing to sit in w/c for a break. Patient propelled self rest of the way > ADL apartment and performed tub/shower transfer on/off tub transfer bench with close supervision. Patient used leg lifter to get BLEs in/out of tub. Patient with increased anxiety during sit>stand from bench. Therapist provided support and encouraged patient to take a break and perform  pursed lip breathing as a strategy to deal with increased anxiety.  After second attempt, patient able to stand with supervision. Patient ambulated > w/c and propelled self back to room. Patient's husband present entire session and provided emotional support as needed.   Precautions:  Precautions Precautions: Fall;Knee Required Braces or Orthoses:  (has been using KI on L for comfort at times prn) Restrictions Weight Bearing Restrictions: Yes RLE Weight Bearing: Weight bearing as tolerated LLE Weight Bearing: Weight bearing as tolerated  See FIM for current functional status  [name] 08/09/2013, 7:22 AM

## 2013-08-09 NOTE — Progress Notes (Signed)
Patient places herself on CPAP home unit. No assistance needed.

## 2013-08-10 ENCOUNTER — Inpatient Hospital Stay (HOSPITAL_COMMUNITY): Payer: BC Managed Care – PPO | Admitting: *Deleted

## 2013-08-10 ENCOUNTER — Inpatient Hospital Stay (HOSPITAL_COMMUNITY): Payer: BC Managed Care – PPO | Admitting: Physical Therapy

## 2013-08-10 ENCOUNTER — Encounter (HOSPITAL_COMMUNITY): Payer: BC Managed Care – PPO | Admitting: Occupational Therapy

## 2013-08-10 ENCOUNTER — Inpatient Hospital Stay (HOSPITAL_COMMUNITY): Payer: BC Managed Care – PPO | Admitting: Occupational Therapy

## 2013-08-10 LAB — URINE MICROSCOPIC-ADD ON

## 2013-08-10 LAB — URINALYSIS, ROUTINE W REFLEX MICROSCOPIC
Bilirubin Urine: NEGATIVE
Glucose, UA: NEGATIVE mg/dL
Ketones, ur: NEGATIVE mg/dL
NITRITE: NEGATIVE
PROTEIN: NEGATIVE mg/dL
Specific Gravity, Urine: 1.015 (ref 1.005–1.030)
UROBILINOGEN UA: 1 mg/dL (ref 0.0–1.0)
pH: 6 (ref 5.0–8.0)

## 2013-08-10 NOTE — Progress Notes (Signed)
[place] PHYSICAL MEDICINE & REHABILITATION     PROGRESS NOTE    Subjective/Complaints: Sore overnight. Not sure if it is med change or just that she did a lot with therapies yesterday. Tired this am  Objective: Vital Signs: Blood pressure 117/81, pulse 70, temperature 98.1 F (36.7 C), temperature source Oral, resp. rate 19, height 5\' 10"  (1.778 m), weight 140.7 kg (310 lb 3 oz), SpO2 98.00%. No results found. No results found for this basename: WBC, HGB, HCT, PLT,  in the last 72 hours No results found for this basename: NA, K, CL, CO, GLUCOSE, BUN, CREATININE, CALCIUM,  in the last 72 hours CBG (last 3)  No results found for this basename: GLUCAP,  in the last 72 hours  Wt Readings from Last 3 Encounters:  08/05/13 140.7 kg (310 lb 3 oz)  08/02/13 131.999 kg (291 lb 0.1 oz)  08/02/13 131.999 kg (291 lb 0.1 oz)    Physical Exam:  Constitutional: She is oriented to person, place, and time.  HENT:  Head: Normocephalic.  Eyes: EOM are normal.  Neck: Normal range of motion. Neck supple. No thyromegaly present.  Cardiovascular: Normal rate and regular rhythm.  Respiratory: Effort normal and breath sounds normal. No respiratory distress.  GI: Soft. Bowel sounds are normal. She exhibits no distension.  Obese  Neurological: She is alert and oriented to person, place, and time.  Skin:  Bilateral knee incisions are dressed and appropriately tender. No erythema. Minimal dressingDorsi flexion plantar flexion intact  Motor strength is 5/5 bilateral deltoid, bicep, tricep, grip.  2 minus hip flexion 1-2/5  knee extension 4/5 ankle dorsiflexion plantar flexion  Sensory intact to light touch in bilateral upper and lower limbs  Musc: edema around knees, appropriate. Knees tender to palpation, ROM Psych: a little anxious  Assessment/Plan: 1. Functional deficits secondary to OA of knees s/p bilateral TKA's which require 3+ hours per day of interdisciplinary therapy in a comprehensive  inpatient rehab setting. Physiatrist is providing close team supervision and 24 hour management of active medical problems listed below. Physiatrist and rehab team continue to assess barriers to discharge/monitor patient progress toward functional and medical goals.  FIM: FIM - Bathing Bathing Steps Patient Completed: Chest;Right Arm;Left Arm;Abdomen;Front perineal area;Right upper leg;Left upper leg;Buttocks;Right lower leg (including foot);Left lower leg (including foot) Bathing: 5: Supervision: Safety issues/verbal cues  FIM - Upper Body Dressing/Undressing Upper body dressing/undressing steps patient completed: Thread/unthread right bra strap;Thread/unthread left bra strap;Thread/unthread right sleeve of pullover shirt/dresss;Thread/unthread left sleeve of pullover shirt/dress;Put head through opening of pull over shirt/dress;Pull shirt over trunk;Hook/unhook bra Upper body dressing/undressing: 5: Set-up assist to: Obtain clothing/put away FIM - Lower Body Dressing/Undressing Lower body dressing/undressing steps patient completed: Thread/unthread right underwear leg;Thread/unthread left underwear leg;Thread/unthread right pants leg;Thread/unthread left pants leg;Don/Doff right sock;Don/Doff left sock;Pull underwear up/down;Pull pants up/down;Don/Doff right shoe;Don/Doff left shoe;Fasten/unfasten right shoe;Fasten/unfasten left shoe Lower body dressing/undressing: 5: Supervision: Safety issues/verbal cues  FIM - Toileting Toileting steps completed by patient: Adjust clothing prior to toileting;Performs perineal hygiene;Adjust clothing after toileting Toileting Assistive Devices: Grab bar or rail for support Toileting: 6: Assistive device: No helper  FIM - Radio producer Devices: Bedside commode;Elevated toilet seat;Grab bars;Walker Toilet Transfers: 5-To toilet/BSC: Supervision (verbal cues/safety issues);5-From toilet/BSC: Supervision (verbal cues/safety  issues)  FIM - Control and instrumentation engineer Devices: Copy: 5: Supine > Sit: Supervision (verbal cues/safety issues);5: Sit > Supine: Supervision (verbal cues/safety issues);4: Bed > Chair or W/C: Min A (steadying Pt. > 75%);4:  Chair or W/C > Bed: Min A (steadying Pt. > 75%)  FIM - Locomotion: Wheelchair Distance: 150 Locomotion: Wheelchair: 5: Travels 150 ft or more: maneuvers on rugs and over door sills with supervision, cueing or coaxing FIM - Locomotion: Ambulation Locomotion: Ambulation Assistive Devices: Administrator Ambulation/Gait Assistance: 5: Supervision Locomotion: Ambulation: 1: Travels less than 50 ft with supervision/safety issues  Comprehension Comprehension Mode: Auditory Comprehension: 6-Follows complex conversation/direction: With extra time/assistive device  Expression Expression Mode: Verbal Expression: 6-Expresses complex ideas: With extra time/assistive device  Social Interaction Social Interaction: 6-Interacts appropriately with others with medication or extra time (anti-anxiety, antidepressant).  Problem Solving Problem Solving: 6-Solves complex problems: With extra time  Memory Memory: 7-Complete Independence: No helper  Medical Problem List and Plan:  1. 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 DVT Prophylaxis/Anticoagulation: Xarelto. Monitor for bleeding episodes. Check vascular study  3. Pain Management: Oxycodone and Robaxin as needed. Monitor with increased mobility   -ms contin trial 15mg  q12---observe for efficacy  -scheduled robaxin 4. acute blood loss anemia. Continue iron supplement. Followup CBC  5. Neuropsych: This patient is capable of making decisions on her own behalf.   -a little anxious at times 6. Morbid obesity. BMI 41.15 kg. Discuss weight loss diet dietary consult  7. GERD. Protonix   LOS (Days) 5 A FACE TO  FACE EVALUATION WAS PERFORMED  Meredith Staggers 08/10/2013 7:55 AM

## 2013-08-10 NOTE — Progress Notes (Addendum)
Physical Therapy Session Note  Patient Details  Name: [name] MRN: [id number] Date of Birth: 07-01-1970  Today's Date: 08/10/2013 Time: 1012-1108 Time Calculation (min): 56 min  Short Term Goals: PT Short Term Goal 1 (Week 1): Pt will be able to perform sit <-> supine with min A and use of AE prn PT Short Term Goal 1 - Progress (Week 1): Met PT Short Term Goal 2 (Week 1): Pt will be able to transfer with min A PT Short Term Goal 2 - Progress (Week 1): Met PT Short Term Goal 3 (Week 1): Pt will gait x 50' with S PT Short Term Goal 3 - Progress (Week 1): Progressing toward goal PT Short Term Goal 4 (Week 1): Pt will be able to go up/down 5 steps with R handrail for home entry with min A PT Short Term Goal 4 - Progress (Week 1): Met  Skilled Therapeutic Interventions/Progress Updates:  Pt received in room seated in w/c. Pt. Indicates increasing stiffness and soreness in BLE. Pt. Performed w/c mobility room>gym with distant supervision with verbal cues for safety. AAROM BLE x5 ea. Knee flexion: L 85, R 87 with empty end feel due to pt. Reported unrated pain.  Pt. Performed multiple sit<>stand transfers with RW min guard for safety. Pt. Performed Therapeutic Exercises: see below. Therapeutic Activity: 4-steps ascending/decending with R rail min A. Pt. Ambulated 125' with RW with verbal cues for step-trough gait pattern with equal step lengths for L and R min guard for safety. Pt. Returned gym>room and performed w/c to recliner transfer with RW at min guard.  Therapy Documentation Precautions:  Precautions Precautions: Fall;Knee Required Braces or Orthoses:  (has been using KI on L for comfort at times prn) Restrictions Weight Bearing Restrictions: Yes RLE Weight Bearing: Weight bearing as tolerated LLE Weight Bearing: Weight bearing as tolerated  Pain: Pain Assessment Pain Assessment: 0-10 Pain Score: 7  Pain Type: Chronic pain Pain Location: Knee Pain Orientation:  Right;Left Pain Descriptors / Indicators: Aching;Sharp Pain Onset: On-going Patients Stated Pain Goal: 5 Pain Intervention(s): Emotional support;Repositioned  Locomotion : Ambulation Ambulation/Gait Assistance: 5: Supervision Wheelchair Mobility Distance: 150   Exercises: 2x10 Mini squats at blue mat with RW for stability 10  standing knee raises BLE each  See FIM for current functional status  Therapy/Group: Individual Therapy  Juluis Mire 08/10/2013, 4:35 PM

## 2013-08-10 NOTE — Progress Notes (Signed)
Occupational Therapy Session Notes  Patient Details  Name: [name] MRN: [id number] Date of Birth: 09-19-1970  Today's Date: 08/10/2013  Short Term Goals: Week 1:  OT Short Term Goal 1 (Week 1): Short Term Goals = Long Term Goals due to ELOS  Skilled Therapeutic Interventions/Progress Updates:   Session #1 [phone] - 11 Minutes Individual Therapy Patient with 6/10 complaints of pain in BLEs, RN aware and patient stated she recently had medication Patient received supine in bed crocheting. Patient relaxed and with decreased anxiety upon therapist entering room. Patient even commented on how crocheting helps relax her; therefore therapist encouraged this activity to help decrease anxiety. Patient engaged in bed mobility and transferred EOB>standing with RW. Patient ambulated into bathroom for toilet transfer, toileting, then shower stall transfer. UB/LB bathing completed in seated position from tub transfer bench. Patient dried and also completed UB/LB dressing in sit<>stand position from tub bench. Patient ambulated > room for grooming tasks of brushing teeth standing at sink. Focused skilled intervention on decreasing anxiety, sit<>stands, functional transfers, overall functional strengthening/endurance, and ADL retraining. Patient sat in w/c to donn bilateral TEDs and bilateral shoes. Therapist and patient discussed safety regarding home management tasks and community re-entry. At end of session, left patient seated in w/c with all needs within reach.   Session #2 1330-1400 - 30 Minutes Individual Therapy Patient with complaints of pain, no rate given; RN aware Patient received seated in w/c. Patient propelled self from room > ADL apartment. Focused skilled intervention on functional mobility, sit<>stands, simple meal prep activity in kitchen, and overall activity tolerance/endurance. Patient with decreased endurance, requiring frequent breaks. Therapist encouraged patient to conserve  energy and take breaks as needed and appropriately. Patient's anxiety tends to increase when getting tired. Patient mod I for simple meal prep using RW. Patient propelled self back to room and left seated in w/c with all needs within reach; family present in room.   Precautions:  Precautions Precautions: Fall;Knee Required Braces or Orthoses:  (has been using KI on L for comfort at times prn) Restrictions Weight Bearing Restrictions: Yes RLE Weight Bearing: Weight bearing as tolerated LLE Weight Bearing: Weight bearing as tolerated  See FIM for current functional status  [name] 08/10/2013, 7:23 AM

## 2013-08-10 NOTE — Progress Notes (Signed)
Physical Therapy Session Note  Patient Details  Name: [name] MRN: [id number] Date of Birth: [birth year]  Today's Date: 08/10/2013 Time: 1455-1540 Time Calculation (min): 45 min  Short Term Goals: Week 1:  PT Short Term Goal 1 (Week 1): Pt will be able to perform sit <-> supine with min A and use of AE prn PT Short Term Goal 2 (Week 1): Pt will be able to transfer with min A PT Short Term Goal 3 (Week 1): Pt will gait x 50' with S PT Short Term Goal 4 (Week 1): Pt will be able to go up/down 5 steps with R handrail for home entry with min A  Skilled Therapeutic Interventions/Progress Updates:  Tx foucsed on gait with RW, stairs, curb, car transfer, and increasing knee flexion with functional mobility.  Pt able to propell WC to gym with distant S.   Performed gait training with RW 2x25' and 1x100' with min-guard A. Provided demonstration and instruction on RW management, posture, flexion during swing phase, continuous pattern, and decreasing UE weight bearing. Pt did a nice job modifying gait pattern, but continued forward flexed posture with fatigue.   Demonstrated car transfer with close S, and simulated as much as possible, but limited due to body habitus and high floor board. So not lifted LEs into car. Pt able to verbalize understanding for her Kia with leg lifter and assist prn.  Ascended/descended 4 stairs with R rail and min-guard A with cues for efficiency.   Instructed pt in curb step with min-guard A x2.   Performed all transfers with S/min A depending on surface height. Instructed pt in safest technique with less trunk flexion, and increasing knee flexion as able.   Pt left up in [facility] with all needs in reach and no     Therapy Documentation Precautions:  Precautions Precautions: Fall;Knee Required Braces or Orthoses:  (has been using KI on L for comfort at times prn) Restrictions Weight Bearing Restrictions: Yes RLE Weight Bearing: Weight bearing as tolerated LLE  Weight Bearing: Weight bearing as tolerated General:   Vital Signs: Therapy Vitals Temp: 98.4 F (36.9 C) Temp src: Oral Pulse Rate: 83 Resp: 18 BP: 111/75 mmHg Patient Position (if appropriate): Sitting Oxygen Therapy SpO2: 100 % O2 Device: None (Room air) Pain: 6/10 bil knees, modified tx   Locomotion : Ambulation Ambulation/Gait Assistance: 5: Supervision Wheelchair Mobility Distance: 150   See FIM for current functional status  Therapy/Group: Individual Therapy Kennieth Rad, PT, DPT   Soundra Pilon 08/10/2013, 3:45 PM

## 2013-08-11 ENCOUNTER — Inpatient Hospital Stay (HOSPITAL_COMMUNITY): Payer: BC Managed Care – PPO

## 2013-08-11 ENCOUNTER — Encounter (HOSPITAL_COMMUNITY): Payer: BC Managed Care – PPO | Admitting: Occupational Therapy

## 2013-08-11 ENCOUNTER — Inpatient Hospital Stay (HOSPITAL_COMMUNITY): Payer: BC Managed Care – PPO | Admitting: Occupational Therapy

## 2013-08-11 DIAGNOSIS — Z96659 Presence of unspecified artificial knee joint: Secondary | ICD-10-CM

## 2013-08-11 DIAGNOSIS — M1710 Unilateral primary osteoarthritis, unspecified knee: Secondary | ICD-10-CM

## 2013-08-11 MED ORDER — NITROFURANTOIN MONOHYD MACRO 100 MG PO CAPS
100.0000 mg | ORAL_CAPSULE | Freq: Two times a day (BID) | ORAL | Status: DC
  Administered 2013-08-11 – 2013-08-13 (×5): 100 mg via ORAL
  Filled 2013-08-11 (×9): qty 1

## 2013-08-11 NOTE — Progress Notes (Signed)
Social Work Patient ID: [name], female   DOB: Feb 08, 1971, 43 y.o.   MRN: [id number]  CSW met with pt to update her on team conference discussion.  Pt is scheduled for d/c on 08-13-13 and she is very excited about that.  CSW will order DME so that it is delivered to pt's room by 08-12-13 so that husband can take it home and get everything set for her.  Pt will receive HH from [place] for PT and CSW will obtain a handicap placard application for pt for temporary use.  Pt feels prepared to go home.  CSW will continue to follow and assure arrangements are in place.

## 2013-08-11 NOTE — Progress Notes (Signed)
Physical Therapy Session Note  Patient Details  Name: [name] MRN: [id number] Date of Birth: 1970-08-28  Today's Date: 08/11/2013 Time: 1445-1530 Time Calculation (min): 45 min  Short Term Goals: Week 1:  PT Short Term Goal 1 (Week 1): Pt will be able to perform sit <-> supine with min A and use of AE prn PT Short Term Goal 1 - Progress (Week 1): Met PT Short Term Goal 2 (Week 1): Pt will be able to transfer with min A PT Short Term Goal 2 - Progress (Week 1): Met PT Short Term Goal 3 (Week 1): Pt will gait x 50' with S PT Short Term Goal 3 - Progress (Week 1): Progressing toward goal PT Short Term Goal 4 (Week 1): Pt will be able to go up/down 5 steps with R handrail for home entry with min A PT Short Term Goal 4 - Progress (Week 1): Met  Skilled Therapeutic Interventions/Progress Updates:    [name] focused on overall strengthening, endurance, and ROM including w/c propulsion on unit, gait with RW on unit (improved posture and knee flexion noted without cueing), and supine/seated therex for BLE including AAROM heel slides, active assisted SLR, SAQ, and knee flexion/extension EOM x 10 reps x 2 sets each BLE. Discussed progression with therapies from HHPT to OPPT and answered pt's questions regarding HEP after d/c. Pt overall S approaching mod I for mobility with RW. Adjusted personal RW that was delivered to pt's room to appropriate height.  Therapy Documentation Precautions:  Precautions Precautions: Fall;Knee Required Braces or Orthoses:  (has been using KI on L for comfort at times prn) Restrictions Weight Bearing Restrictions: Yes RLE Weight Bearing: Weight bearing as tolerated LLE Weight Bearing: Weight bearing as tolerated  Pain: Premedicated. Rest breaks during therex as needed.   See FIM for current functional status  Therapy/Group: Individual Therapy  Lars Masson 08/11/2013, 4:05 PM

## 2013-08-11 NOTE — Progress Notes (Signed)
Pt has home CPAP at bedside and stated she does not need anything assistance or anything for it. She will place it on herself. RT reminded her we are available if she needs any assistance just to call. RT will continue to monitor.

## 2013-08-11 NOTE — Patient Care Conference (Signed)
Inpatient RehabilitationTeam Conference and Plan of Care Update Date: 08/10/2013   Time: 2:15 PM    Patient Name: [name]      Medical Record Number: [id number]  Date of Birth: 1970/04/29 Sex: Female         Room/Bed: 4M11C/4M11C-01 Payor Info: Payor: [place] / Plan: [place] PPO / Product Type: *No Product type* /    Admitting Diagnosis: B TKR  Admit Date/Time:  08/05/2013  3:47 PM Admission Comments: No comment available   Primary Diagnosis:  <principal problem not specified> Principal Problem: <principal problem not specified>  Patient Active Problem List   Diagnosis Date Noted  . Status post total bilateral knee replacement 08/05/2013  . Expected blood loss anemia 08/04/2013  . S/P bilateral TKA 08/02/2013  . Morbid obesity 10/12/2010  . Hypertension 10/12/2010  . RUQ PAIN 05/04/2010  . EPIGASTRIC PAIN 05/04/2010    Expected Discharge Date: Expected Discharge Date: 08/13/13  Team Members Present: Physician leading conference: Dr. Alger Simons Social Worker Present: Alfonse Alpers, LCSW Nurse Present: Elliot Cousin, RN PT Present: Canary Brim, PT OT Present: Blanchard Mane, OT;Kawanna Christley Tamala Julian, OT;Patricia Lissa Hoard, OT SLP Present: Weston Anna, SLP PPS Coordinator present : Daiva Nakayama, RN, CRRN     Current Status/Progress Goal Weekly Team Focus  Medical   double TKA's, morbid obesity. pain issues/anxiety  improve activity tolerance  pain mgt, anxiety control, wound care   Bowel/Bladder   ? bladder infection; cloudy amber urine with foul odor; hx of cysto and abx for recurrent UTI  Treatment for UTI and improvement in discomfort when urinating  assess results of urne specimen for UA/C+S and encourage fluids/cranberry juice   Swallow/Nutrition/ Hydration             ADL's   overall supervision  overall mod I  BADL retraining, sit<>stands from various surfaces, functional mobility/transfers, overall activity tolerance/endurance, pain  management, d/c planning,    Mobility   S/min A overall  mod I overall; S stairs and min A car  d/c planning, ROM/strengthening, endurance, stair negotiation, car transfer   Communication             Safety/Cognition/ Behavioral Observations            Pain   Pain fairly managed with q12 mscontin and q3 hr prn medication and ice  Pain controlled with scheduled meds and prn medications  monitor need for prn medication with scheduled medication and use of non med alternative pain control methods   Skin   mepilex dressings to bil knee  no dressing to knees  assess incision post surgical dressing removal by MD    Rehab Goals Patient on target to meet rehab goals: Yes Rehab Goals Revised: None *See Care Plan and progress notes for long and short-term goals.  Barriers to Discharge: anxiety, pain    Possible Resolutions to Barriers:  reassurance, pain mgt, adaptive techniques    Discharge Planning/Teaching Needs:  Pt to return to her home to the care of her husband and 3 grown children.  Pt's husband has been present for therapy sessions.   Team Discussion:  Pt with anxiety and pain related to her knees and her rehab.  Pt's pain is better and she is doing well with therapy, although her pain medication makes her feel "loopy".  Pt has good range of motion with both knees.  She will need some equipment at d/c.  Revisions to Treatment Plan:  None   Continued Need for Acute  Rehabilitation Level of Care: The patient requires daily medical management by a physician with specialized training in physical medicine and rehabilitation for the following conditions: Daily direction of a multidisciplinary physical rehabilitation program to ensure safe treatment while eliciting the highest outcome that is of practical value to the patient.: Yes Daily medical management of patient stability for increased activity during participation in an intensive rehabilitation regime.: Yes Daily analysis of  laboratory values and/or radiology reports with any subsequent need for medication adjustment of medical intervention for : Post surgical problems;Other  [name] 08/11/2013, 2:09 PM

## 2013-08-11 NOTE — Progress Notes (Signed)
Occupational Therapy Session Notes  Patient Details  Name: [name] MRN: [id number] Date of Birth: [birth year]  Today's Date: 08/11/2013  Short Term Goals: Week 1:  OT Short Term Goal 1 (Week 1): Short Term Goals = Long Term Goals due to ELOS  Skilled Therapeutic Interventions/Progress Updates:   Session #1 0930-1030 - 60 Minutes Individual Therapy Patient 4/10 pain in BLEs/knees, RN aware Patient received supine in bed with daughter present. Patient engaged in bed mobility with supervision and ambulated into bathroom for shower stall transfer and UB/LB bathing seated on tub transfer bench. Patient ambulated out of bathroom > bed for donning of TEDs and LB dressing. Notified RN of dressing changes > bilateral knees. Patient ambulated > sink and grooming tasks of brushing teeth completed seated in w/c. Patient overall mod I > supervision with tasks, patient's daughter okay to assist patient within room for functional mobility/transfers with patient using RW. At end of session, left patient seated in w/c with daughter present and all needs within reach.   Session #2 4097-3532 - 33 Minutes Individual Therapy Patient with 3/10 complaints of pain, no medication needed at this time Patient received seated EOB. Patient ambulated into bathroom with RW at mod I level. Toileting tasks of clothing management and peri cleansing completed at mod I level. Patient then transferred > w/c and propelled self > 1st floor for focus on community re-entry bathroom mobility & transfers. Patient able to complete handicap stall community bathroom transfer with supervision, requiring min verbal cues for safe & effective transfers. Patient propelled self > 4 [place] and performed car transfer in/out of simulated car with son present for education; patient required min assist for in/out of car for RLE management. At end of session, therapist left patient with her son; son okay to assist with transfers in  room.  Precautions:  Precautions Precautions: Fall;Knee Required Braces or Orthoses:  (has been using KI on L for comfort at times prn) Restrictions Weight Bearing Restrictions: Yes RLE Weight Bearing: Weight bearing as tolerated LLE Weight Bearing: Weight bearing as tolerated  See FIM for current functional status  [name] 08/11/2013, 7:22 AM

## 2013-08-11 NOTE — Progress Notes (Addendum)
[facility] [place] PHYSICAL MEDICINE & REHABILITATION     PROGRESS NOTE    Subjective/Complaints: Overall seeing improvement. Had some urinary urgency/UTI symptoms yesterday  Objective: Vital Signs: Blood pressure 118/57, pulse 97, temperature 98.1 F (36.7 C), temperature source Oral, resp. rate 19, height 5\' 10"  (1.778 m), weight 140.7 kg (310 lb 3 oz), SpO2 100.00%. No results found. No results found for this basename: WBC, HGB, HCT, PLT,  in the last 72 hours No results found for this basename: NA, K, CL, CO, GLUCOSE, BUN, CREATININE, CALCIUM,  in the last 72 hours CBG (last 3)  No results found for this basename: GLUCAP,  in the last 72 hours  Wt Readings from Last 3 Encounters:  08/05/13 140.7 kg (310 lb 3 oz)  08/02/13 131.999 kg (291 lb 0.1 oz)  08/02/13 131.999 kg (291 lb 0.1 oz)    Physical Exam:  Constitutional: She is oriented to person, place, and time.  HENT:  Head: Normocephalic.  Eyes: EOM are normal.  Neck: Normal range of motion. Neck supple. No thyromegaly present.  Cardiovascular: Normal rate and regular rhythm.  Respiratory: Effort normal and breath sounds normal. No respiratory distress.  GI: Soft. Bowel sounds are normal. She exhibits no distension.  Obese  Neurological: She is alert and oriented to person, place, and time.  Skin:  Bilateral knee incisions are dressed and appropriately tender. No erythema. Minimal dressingDorsi flexion plantar flexion intact  Motor strength is 5/5 bilateral deltoid, bicep, tricep, grip.  2 minus hip flexion 1-2/5  knee extension 4/5 ankle dorsiflexion plantar flexion  Sensory intact to light touch in bilateral upper and lower limbs  Musc: edema around knees, appropriate. Knees tender to palpation, ROM Psych: a little anxious  Assessment/Plan: 1. Functional deficits secondary to OA of knees s/p bilateral TKA's which require 3+ hours per day of interdisciplinary therapy in a comprehensive inpatient rehab setting. Physiatrist  is providing close team supervision and 24 hour management of active medical problems listed below. Physiatrist and rehab team continue to assess barriers to discharge/monitor patient progress toward functional and medical goals.  FIM: FIM - Bathing Bathing Steps Patient Completed: Chest;Right Arm;Left Arm;Abdomen;Front perineal area;Right upper leg;Left upper leg;Buttocks;Right lower leg (including foot);Left lower leg (including foot) Bathing: 6: Assistive device (Comment)  FIM - Upper Body Dressing/Undressing Upper body dressing/undressing steps patient completed: Thread/unthread right bra strap;Thread/unthread left bra strap;Thread/unthread right sleeve of pullover shirt/dresss;Thread/unthread left sleeve of pullover shirt/dress;Put head through opening of pull over shirt/dress;Pull shirt over trunk;Hook/unhook bra Upper body dressing/undressing: 7: Complete Independence: No helper FIM - Lower Body Dressing/Undressing Lower body dressing/undressing steps patient completed: Thread/unthread right underwear leg;Thread/unthread left underwear leg;Thread/unthread right pants leg;Thread/unthread left pants leg;Don/Doff right sock;Don/Doff left sock;Pull underwear up/down;Pull pants up/down;Don/Doff right shoe;Don/Doff left shoe;Fasten/unfasten right shoe;Fasten/unfasten left shoe Lower body dressing/undressing: 7: Complete Independence: No helper  FIM - Toileting Toileting steps completed by patient: Adjust clothing prior to toileting;Performs perineal hygiene;Adjust clothing after toileting Toileting Assistive Devices: Grab bar or rail for support Toileting: 6: Assistive device: No helper  FIM - Radio producer Devices: Bedside commode;Elevated toilet seat;Grab bars;Walker Toilet Transfers: 5-To toilet/BSC: Supervision (verbal cues/safety issues);5-From toilet/BSC: Supervision (verbal cues/safety issues)  FIM - Engineer, site Assistive  Devices: Arm rests;Walker Bed/Chair Transfer: 4: Bed > Chair or W/C: Min A (steadying Pt. > 75%);5: Chair or W/C > Bed: Supervision (verbal cues/safety issues)  FIM - Locomotion: Wheelchair Distance: 150 Locomotion: Wheelchair: 5: Travels 150 ft or more: maneuvers on rugs and over  door sills with supervision, cueing or coaxing FIM - Locomotion: Ambulation Locomotion: Ambulation Assistive Devices: Walker - Rolling Ambulation/Gait Assistance: 5: Supervision Locomotion: Ambulation: 2: Travels 50 - 149 ft with supervision/safety issues  Comprehension Comprehension Mode: Auditory Comprehension: 6-Follows complex conversation/direction: With extra time/assistive device  Expression Expression Mode: Verbal Expression: 6-Expresses complex ideas: With extra time/assistive device  Social Interaction Social Interaction: 6-Interacts appropriately with others with medication or extra time (anti-anxiety, antidepressant).  Problem Solving Problem Solving: 6-Solves complex problems: With extra time  Memory Memory: 7-Complete Independence: No helper  Medical Problem List and Plan:  1. 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 DVT Prophylaxis/Anticoagulation: Xarelto.  3. Pain Management: Oxycodone and Robaxin as needed. Monitor with increased mobility   -ms contin trial 15mg  q12---seems to be tolerating better.  -scheduled robaxin 4. acute blood loss anemia. Continue iron supplement. Followup CBC  5. Neuropsych: This patient is capable of making decisions on her own behalf.   -a little anxious at times 6. Morbid obesity. BMI 41.15 kg. 7. GERD. Protonix  8. Uro: UA positive. cx pending  -begin empiric macrobid  LOS (Days) 6 A FACE TO FACE EVALUATION WAS PERFORMED  Meredith Staggers 08/11/2013 8:16 AM

## 2013-08-11 NOTE — Progress Notes (Signed)
Patient has home CPAP, will place on herself.  No RT assistance needed at this time.

## 2013-08-11 NOTE — Progress Notes (Signed)
Physical Therapy Session Note  Patient Details  Name: [name] MRN: [id number] Date of Birth: Nov 02, 1970  Today's Date: 08/11/2013 Time: 7673-4193 Time Calculation (min): 41 min  Short Term Goals: Week 1:  PT Short Term Goal 1 (Week 1): Pt will be able to perform sit <-> supine with min A and use of AE prn PT Short Term Goal 1 - Progress (Week 1): Met PT Short Term Goal 2 (Week 1): Pt will be able to transfer with min A PT Short Term Goal 2 - Progress (Week 1): Met PT Short Term Goal 3 (Week 1): Pt will gait x 50' with S PT Short Term Goal 3 - Progress (Week 1): Progressing toward goal PT Short Term Goal 4 (Week 1): Pt will be able to go up/down 5 steps with R handrail for home entry with min A PT Short Term Goal 4 - Progress (Week 1): Met  Skilled Therapeutic Interventions/Progress Updates:   Session focused on functional gait with RW (overall S with emphasis on gait pattern and technique to increase knee flexion and improve posture) on unit, simulated bed mobility and transfer with step to elevated bed height x 2 reps with S/mod I using leg lifter, car transfer x 2 with S for transfer (and pt verbalized carryover from technique suggested yesterday for reclining seat, scooting it back, and using leg lifter for LE management as needed) and toilet transfers S/mod I in pt room.   Therapy Documentation Precautions:  Precautions Precautions: Fall;Knee Required Braces or Orthoses:  (has been using KI on L for comfort at times prn) Restrictions Weight Bearing Restrictions: Yes RLE Weight Bearing: Weight bearing as tolerated LLE Weight Bearing: Weight bearing as tolerated Pain: Reports pain is manageable. Locomotion : Ambulation Ambulation/Gait Assistance: 5: Supervision   See FIM for current functional status  Therapy/Group: Individual Therapy  Lars Masson 08/11/2013, 12:18 PM

## 2013-08-12 ENCOUNTER — Inpatient Hospital Stay (HOSPITAL_COMMUNITY): Payer: BC Managed Care – PPO | Admitting: *Deleted

## 2013-08-12 ENCOUNTER — Inpatient Hospital Stay (HOSPITAL_COMMUNITY): Payer: BC Managed Care – PPO

## 2013-08-12 ENCOUNTER — Encounter (HOSPITAL_COMMUNITY): Payer: BC Managed Care – PPO | Admitting: Occupational Therapy

## 2013-08-12 NOTE — Discharge Summary (Signed)
[name], [name]              ACCOUNT NO.:  [id number]  MEDICAL RECORD NO.:  [id number]  LOCATION:  4M11C                        FACILITY:  [facility]  PHYSICIAN:  Meredith Staggers, M.D.DATE OF BIRTH:  06/05/1970  DATE OF ADMISSION:  08/05/2013 DATE OF DISCHARGE:  08/13/2013                              DISCHARGE SUMMARY   DISCHARGE DIAGNOSES: 1. Functional deficit secondary to bilateral total knee replacement     secondary to end-stage osteoarthritis on Aug 02, 2013. 2. Xarelto for deep venous thrombosis prophylaxis. 3. Pain management. 4. Acute blood loss anemia. 5. Morbid obesity with BMI of 41.15. 6. Gastroesophageal reflux disease. 7. E. coli urinary tract infection.  HISTORY OF PRESENT ILLNESS:  This is a 43 year old right-handed female with history of sleep apnea, morbid obesity, admitted on Aug 02, 2013, with progressive bilateral knee pain over 2-3 years and no relief with conservative care.  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  Underwent bilateral total knee replacement on Aug 02, 2013, per Dr. Alvan Dame.  Postoperative pain management.  Weightbearing as tolerated bilateral lower extremities.  Placed on Xarelto for DVT prophylaxis.  Acute blood loss anemia 10.3 and monitored.  Physical and occupational therapy on going.  The patient was admitted for comprehensive rehab program.  PAST MEDICAL HISTORY:  See discharge diagnoses.  SOCIAL HISTORY:  Lives with spouse.  Works as a Education officer, museum.  FUNCTIONAL HISTORY:  Prior to admission independent.  Functional status upon admission to rehab services was +2 physical assist sit to stand, +2 min assist stand pivot transfers, +2 physical assist ambulation 11 feet, min to mod assist activities of daily living.  PHYSICAL EXAMINATION:  VITAL SIGNS:  Blood pressure 143/76, pulse 91, temperature 98.2. GENERAL: This was an alert female, oriented x3. HEENT:  Pupils were round and reactive to  light. LUNGS:  Clear to auscultation. CARDIAC:  Regular rate and rhythm. ABDOMEN:  Soft and nontender.  Good bowel sounds. EXTREMITIES:  Bilateral knee incisions were dressed appropriately, tender without any redness or drainage.  REHABILITATION HOSPITAL COURSE:  The patient was admitted to inpatient rehab services with therapies initiated on a 3-hour daily basis consisting of physical therapy, occupational therapy, and rehabilitation nursing.  The following issues were addressed during the patient's rehabilitation stay.  Pertaining to [name] bilateral total knee replacements, remained stable, weightbearing as tolerated. Neurovascular sensation intact.  She would follow up with Orthopedic Services.  She remained on Xarelto for DVT prophylaxis.  Venous Doppler studies negative for DVT.  Pain managed with the use of oxycodone as well as Robaxin with the addition of MS Contin and good results. Patient with morbid obesity, BMI of 41.15, she received dietary consultation in regard to weight loss diet.  A urine study did show greater than 100,000 E. coli.  She had initially been placed on empiric Macrobid.  She had no dysuria or hematuria.  The patient received weekly collaborative interdisciplinary team conferences to discuss estimated length of stay, family teaching, and any barriers to her discharge.  She was ambulating extended distances with a rolling walker with supervision.  Simulated bed mobility and transfers, with step to elevated bed height x2 with supervision modified independence,  car transfers with supervision, needing some assistance for lower body activities of daily living.  Strength and endurance continued to improve.  Full family teaching completed and plan was to be discharged to home.  DISCHARGE MEDICATIONS: 1. Colace 100 mg p.o. b.i.d. 2. Ferrous sulfate 325 mg p.o. t.i.d. 3. Flonase 1 spray in each nostril daily. 4. Claritin 10 mg p.o. daily. 5. Robaxin 500  mg p.o. every 6 hours as needed for muscle spasms. 6. MS Contin 15 mg p.o. b.i.d. x2 weeks. 7. Macrobid 100 mg p.o. every 12 hours to complete 7-day course. 8. Oxycodone immediate release 5-20 mg every 3 hours as needed for     severe pain, dispensed #90 tablets. 9. Protonix 20 mg p.o. daily. 10.MiraLAX 17 g p.o. b.i.d., hold for loose stools. 11.Xarelto 10 mg p.o. daily to complete a 40-month course for DVT     Prophylaxis.Begin aspirin 325 mg twice daily x one moth after XARELTO completed  DIET:  Regular.  SPECIAL INSTRUCTIONS:  The patient would follow up Dr. Paralee Cancel at the outpatient orthopedic centers 2 weeks call for appointment; Dr. Alger Simons as needed; Dr. Roe Coombs medical management.  Ongoing therapies were arranged as per rehab services.     Lauraine Rinne, P.A.   ______________________________ Meredith Staggers, M.D.    DA/MEDQ  D:  08/12/2013  T:  08/12/2013  Job:  [id number]  cc:   Meredith Staggers, M.D. Pietro Cassis Alvan Dame, M.D. Roe Coombs, PA

## 2013-08-12 NOTE — Progress Notes (Signed)
Physical Therapy Session Note  Patient Details  Name: [name] MRN: [id number] Date of Birth: 1970-07-13  Today's Date: 08/12/2013 Time:9:35 -10:35 (44min)    Skilled Therapeutic Interventions/Progress Updates:  Tx focused on functional mobility in the room with RW, gait training, car transfer, stairs, and therex for increased knee strength and ROM. Pt given various strategies for increasing ext ROM, esp on RLE while resting.    PROM assessed after prolonged passive stretch:  LLE ext -6, flexion 95 degrees RLE -15 ext and 98 degrees flexion  Pt navigated in room with RW Mod I, including transfers, bed mobility, reaching objects from various heights. Mod I sign on door.  Gait on unit x150' and 100' Mod I with self-correcting posture and gait pattern.   Pt wanting to perform car transfer again, but unable to lift LEs in this small floorboard. Pt able to verbalize technique for her car.   Performed 5 stairs with R rail and S only with safe technique noted.   Instructed pt in supine and seated therex, focusing on quad activation for full knee ext. Instructed pt in supine heel float for gravity assist PROM with quad sets.  Also performed seated LAQ and HS curls with 3 sec holds x10 bil.   Pt left in room with NT getting ice.      Therapy Documentation Precautions:  Precautions Precautions: Fall;Knee Required Braces or Orthoses:  (has been using KI on L for comfort at times prn) Restrictions Weight Bearing Restrictions: Yes RLE Weight Bearing: Weight bearing as tolerated LLE Weight Bearing: Weight bearing as tolerated    Pain: 7/10 knee pain, provided rest breaks and ice p tx  See FIM for current functional status  Therapy/Group: Individual Therapy Kennieth Rad, PT, DPT  08/12/2013, 9:53 AM

## 2013-08-12 NOTE — Discharge Summary (Signed)
Discharge summary job [phone]

## 2013-08-12 NOTE — Progress Notes (Signed)
Occupational Therapy Session Note & Discharge Summary  Patient Details  Name: [name] MRN: [id number] Date of Birth: [birth year]  Today's Date: 08/12/2013  SESSION NOTE 9937-1696 - 64 Minutes Individual Therapy No complaints of pain Patient received in bathroom. Made patient mod I within room. Patient completed functional mobility/ambulation/transfers using RW at mod I level and completed ADL at mod I level. Patient motivated and excited to go home tomorrow. No follow up OT recommended. Drop arm BSC and tub transfer bench recommended and ordered for use at home. Therapist and patient discussed BUE strengthening exercise/HEP and patient independent to complete BUE HEP at home. At end of session, patient left seated on EOB.   ---------------------------------------------------------------------------------------------------------------------  DISCHARGE SUMMARY Patient has met 11 of 11 long term goals due to improved activity tolerance, improved balance, postural control, ability to compensate for deficits, functional use of  RIGHT lower and LEFT lower extremity, improved attention, improved awareness and improved coordination.  Patient to discharge at overall Modified Independent level.  Patient's care partner is independent to provide the necessary supervision prn assistance at discharge.    Reasons goals not met: n/a, all goals met at this time.   Recommendation: No additional occupational therapy recommended at this time.   Equipment: drop arm BSC and tub transfer bench  Reasons for discharge: treatment goals met and discharge from hospital  Patient/family agrees with progress made and goals achieved: Yes  Precautions/Restrictions  Precautions Precautions: Fall;Knee Restrictions Weight Bearing Restrictions: Yes RLE Weight Bearing: Weight bearing as tolerated LLE Weight Bearing: Weight bearing as tolerated  Vital Signs Therapy Vitals Temp: 97.9 F (36.6 C) Temp src:  Oral Pulse Rate: 68 Resp: 18 BP: 119/68 mmHg Patient Position (if appropriate): Lying Oxygen Therapy SpO2: 99 % O2 Device: None (Room air)  ADL - See FIM  Vision/Perception  Vision- History Baseline Vision/History: Wears glasses Wears Glasses: Distance only Patient Visual Report: No change from baseline Vision- Assessment Vision Assessment?: No apparent visual deficits   Cognition Overall Cognitive Status: Within Functional Limits for tasks assessed Orientation Level: Oriented X4 Memory: Appears intact Awareness: Appears intact Problem Solving: Appears intact Safety/Judgment: Appears intact Comments: Patient continues to present with increased anxiety during therapy sessions. Therapist has discussed and educated patient on techniques to decrease anxiety (such as pursed lip breathing, rest breaks, low key activities, etc).  Sensation Sensation Light Touch: Appears Intact Proprioception: Appears Intact Additional Comments: BUEs appear intact Coordination Gross Motor Movements are Fluid and Coordinated: Yes Fine Motor Movements are Fluid and Coordinated: Yes  Motor  Motor Motor: Within Functional Limits  Trunk/Postural Assessment - WFL  Balance - Mod I using RW for UE support during dynamic standing tasks  Extremity/Trunk Assessment RUE Assessment RUE Assessment: Within Functional Limits LUE Assessment LUE Assessment: Within Functional Limits  See FIM for current functional status  [name] 08/12/2013, 3:03 PM

## 2013-08-12 NOTE — Progress Notes (Signed)
[facility] PHYSICAL MEDICINE & REHABILITATION     PROGRESS NOTE    Subjective/Complaints: Getting stronger. Pain control improved. She's very encouraged.  Objective: Vital Signs: Blood pressure 119/68, pulse 68, temperature 97.9 F (36.6 C), temperature source Oral, resp. rate 18, height 5\' 10"  (1.778 m), weight 141.658 kg (312 lb 4.8 oz), SpO2 99.00%. No results found. No results found for this basename: WBC, HGB, HCT, PLT,  in the last 72 hours No results found for this basename: NA, K, CL, CO, GLUCOSE, BUN, CREATININE, CALCIUM,  in the last 72 hours CBG (last 3)  No results found for this basename: GLUCAP,  in the last 72 hours  Wt Readings from Last 3 Encounters:  08/11/13 141.658 kg (312 lb 4.8 oz)  08/02/13 131.999 kg (291 lb 0.1 oz)  08/02/13 131.999 kg (291 lb 0.1 oz)    Physical Exam:  Constitutional: She is oriented to person, place, and time.  HENT:  Head: Normocephalic.  Eyes: EOM are normal.  Neck: Normal range of motion. Neck supple. No thyromegaly present.  Cardiovascular: Normal rate and regular rhythm.  Respiratory: Effort normal and breath sounds normal. No respiratory distress.  GI: Soft. Bowel sounds are normal. She exhibits no distension.  Obese  Neurological: She is alert and oriented to person, place, and time.  Skin:  Bilateral knee incisions are dressed and appropriately tender. No erythema. Minimal dressingDorsi flexion plantar flexion intact  Motor strength is 5/5 bilateral deltoid, bicep, tricep, grip.  2 minus hip flexion 1-2/5  knee extension 4/5 ankle dorsiflexion plantar flexion  Sensory intact to light touch in bilateral upper and lower limbs  Musc: edema around knees, appropriate. Knees tender to palpation, ROM Psych: a little anxious  Assessment/Plan: 1. Functional deficits secondary to OA of knees s/p bilateral TKA's which require 3+ hours per day of interdisciplinary therapy in a comprehensive inpatient rehab setting. Physiatrist is  providing close team supervision and 24 hour management of active medical problems listed below. Physiatrist and rehab team continue to assess barriers to discharge/monitor patient progress toward functional and medical goals.  FIM: FIM - Bathing Bathing Steps Patient Completed: Chest;Right Arm;Left Arm;Abdomen;Front perineal area;Right upper leg;Left upper leg;Buttocks;Right lower leg (including foot);Left lower leg (including foot) Bathing: 6: Assistive device (Comment)  FIM - Upper Body Dressing/Undressing Upper body dressing/undressing steps patient completed: Thread/unthread right bra strap;Thread/unthread left bra strap;Thread/unthread right sleeve of pullover shirt/dresss;Thread/unthread left sleeve of pullover shirt/dress;Put head through opening of pull over shirt/dress;Pull shirt over trunk;Hook/unhook bra Upper body dressing/undressing: 7: Complete Independence: No helper FIM - Lower Body Dressing/Undressing Lower body dressing/undressing steps patient completed: Thread/unthread right underwear leg;Thread/unthread left underwear leg;Thread/unthread right pants leg;Thread/unthread left pants leg;Don/Doff right sock;Don/Doff left sock;Pull underwear up/down;Pull pants up/down;Don/Doff right shoe;Don/Doff left shoe;Fasten/unfasten right shoe;Fasten/unfasten left shoe Lower body dressing/undressing: 6: Assistive device (Comment)  FIM - Toileting Toileting steps completed by patient: Adjust clothing prior to toileting;Performs perineal hygiene;Adjust clothing after toileting Toileting Assistive Devices: Grab bar or rail for support Toileting: 6: Assistive device: No helper  FIM - Radio producer Devices: Elevated toilet seat;Grab bars;Walker Toilet Transfers: 5-To toilet/BSC: Supervision (verbal cues/safety issues);5-From toilet/BSC: Supervision (verbal cues/safety issues)  FIM - Bed/Chair Transfer Bed/Chair Transfer Assistive Devices: Walker (leg  lifter) Bed/Chair Transfer: 6: Supine > Sit: No assist;6: Sit > Supine: No assist;5: Bed > Chair or W/C: Supervision (verbal cues/safety issues);5: Chair or W/C > Bed: Supervision (verbal cues/safety issues)  FIM - Locomotion: Wheelchair Distance: 150 Locomotion: Wheelchair: 5: Travels 50 - 149  ft, turns around, maneuvers to table, bed or toilet, negotiates 3% grade: modified independent FIM - Locomotion: Ambulation Locomotion: Ambulation Assistive Devices: Walker - Rolling Ambulation/Gait Assistance: 5: Supervision Locomotion: Ambulation: 5: Travels 150 ft or more with supervision/safety issues  Comprehension Comprehension Mode: Auditory Comprehension: 6-Follows complex conversation/direction: With extra time/assistive device  Expression Expression Mode: Verbal Expression: 6-Expresses complex ideas: With extra time/assistive device  Social Interaction Social Interaction: 6-Interacts appropriately with others with medication or extra time (anti-anxiety, antidepressant).  Problem Solving Problem Solving: 6-Solves complex problems: With extra time  Memory Memory: 7-Complete Independence: No helper  Medical Problem List and Plan:  1. 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 DVT Prophylaxis/Anticoagulation: Xarelto.  3. Pain Management: Oxycodone and Robaxin as needed. Monitor with increased mobility   -ms contin trial 15mg  q12-  -using less breakthrough pain  -scheduled robaxin 4. acute blood loss anemia. Continue iron supplement. Followup CBC  5. Neuropsych: This patient is capable of making decisions on her own behalf.   -a little anxious at times 6. Morbid obesity. BMI 41.15 kg. 7. GERD. Protonix  8. Uro: 100k ecoli  -continue empiric macrobid  LOS (Days) 7 A FACE TO FACE EVALUATION WAS PERFORMED  Meredith Staggers 08/12/2013 8:08 AM

## 2013-08-12 NOTE — Progress Notes (Signed)
Social Work Discharge Note  The overall goal for the admission was met for:   Discharge location: Yes - home with family  Length of Stay: Yes - 8 days  Discharge activity level: Yes - Modified Independent  Home/community participation: Yes   Services provided included: MD, RD, PT, OT, RN, Pharmacy and SW  Financial Services: Private Insurance: [employer]  Follow-up services arranged: Home Health: PT/OT with [facility], DME: Wide rolling walker; drop arm commode; tub bench from [facility] and Patient/Family has no preference for HH/DME agencies  Comments (or additional information):  Patient/Family verbalized understanding of follow-up arrangements: Yes  Individual responsible for coordination of the follow-up plan: pt with support from her family  Confirmed correct DME delivered: Silvestre Mesi [facility] 08/12/2013    [place]

## 2013-08-12 NOTE — Progress Notes (Signed)
Physical Therapy Treatment Note & Discharge Summary  Patient Details  Name: [name] MRN: [id number] Date of Birth: [birth year]  Today's Date: 08/12/2013 Time: 1300-1410 Time Calculation (min): 70 min Individual therapy; Increased pain in R knee end of session - ice packs applied bilaterally. Focused on functional gait in community environment with RW with education on energy conservation and safety with mobility in the community. Pt at overall S/mod I level outdoors on uneven surfaces and navigating elevator > 300' with RW. Pt propelled w/c over uneven surfaces outdoors also for functional UE strengthening and endurance. Also focused on supine therex for increased ROM and strengthening to BLE including AAROM for heel slides with sheet, hip abduction and knee extension x 2 sets of 10 reps each. Pt mod I with RW in room and mod I for all transfers and bed mobility during this session. Pt denies any concerns for d/c tomorrow.   Patient has met 9 of 9 long term goals due to improved activity tolerance, improved balance, increased strength, increased range of motion, decreased pain, ability to compensate for deficits and functional use of  right lower extremity and left lower extremity.  Patient to discharge at an ambulatory level Modified Independent with RW.   Patient's husband  is independent to provide the necessary supervision assistance at discharge.  Reasons goals not met: n/a - all goals met at this time.  Recommendation:  Patient will benefit from ongoing skilled PT services in home health setting to continue to advance safe functional mobility, address ongoing impairments in gait, balance, ROM, strength, edema, pain, functional mobility, endurance, and minimize fall risk.  Equipment: wide based RW  Reasons for discharge: treatment goals met and discharge from hospital  Patient/family agrees with progress made and goals achieved: Yes  PT  Discharge Precautions/Restrictions Precautions Precautions: Fall;Knee Restrictions Weight Bearing Restrictions: No RLE Weight Bearing: Weight bearing as tolerated LLE Weight Bearing: Weight bearing as tolerated Vision/Perception  Perception Perception: Within Functional Limits Praxis Praxis: Intact  Cognition Overall Cognitive Status: Within Functional Limits for tasks assessed Safety/Judgment: Appears intact Sensation Sensation Light Touch: Appears Intact Proprioception: Appears Intact Coordination Gross Motor Movements are Fluid and Coordinated: Yes Motor  Motor Motor: Within Functional Limits  Locomotion  Ambulation Ambulation/Gait Assistance: 6: Modified independent (Device/Increase time)  Trunk/Postural Assessment  Cervical Assessment Cervical Assessment: Within Functional Limits Thoracic Assessment Thoracic Assessment:  (same as admission) Lumbar Assessment Lumbar Assessment:  (same as admission) Postural Control Postural Control: Within Functional Limits  Balance Balance Balance Assessed: Yes Static Sitting Balance Static Sitting - Level of Assistance: 7: Independent Dynamic Sitting Balance Dynamic Sitting - Level of Assistance: 6: Modified independent (Device/Increase time) Static Standing Balance Static Standing - Level of Assistance: 6: Modified independent (Device/Increase time) Dynamic Standing Balance Dynamic Standing - Level of Assistance: 6: Modified independent (Device/Increase time) Extremity Assessment  RLE AROM (degrees) RLE Overall AROM Comments: PROM -15 ext and 98 flex RLE Strength RLE Overall Strength Comments: 3-/5 at hip; 3+/5 at knee; ankle WFL LLE AROM (degrees) LLE Overall AROM Comments: PROM -6 extension; 95 flexion LLE Strength LLE Overall Strength Comments: 3-/5 hip; 3+/5 knee; ankle [facility]  See FIM for current functional status  Lars Masson 08/12/2013, 3:53 PM

## 2013-08-13 DIAGNOSIS — M1710 Unilateral primary osteoarthritis, unspecified knee: Secondary | ICD-10-CM

## 2013-08-13 DIAGNOSIS — Z96659 Presence of unspecified artificial knee joint: Secondary | ICD-10-CM

## 2013-08-13 LAB — URINE CULTURE: Colony Count: >=100000

## 2013-08-13 MED ORDER — NITROFURANTOIN MONOHYD MACRO 100 MG PO CAPS: 100.0000 mg | ORAL_CAPSULE | Freq: Two times a day (BID) | ORAL | Status: AC

## 2013-08-13 MED ORDER — MORPHINE SULFATE ER 15 MG PO TBCR: 15.0000 mg | EXTENDED_RELEASE_TABLET | Freq: Two times a day (BID) | ORAL | Status: AC

## 2013-08-13 MED ORDER — METHOCARBAMOL 500 MG PO TABS: 500.0000 mg | ORAL_TABLET | Freq: Four times a day (QID) | ORAL | Status: AC | PRN

## 2013-08-13 MED ORDER — RIVAROXABAN 10 MG PO TABS: 10.0000 mg | ORAL_TABLET | Freq: Every day | ORAL | Status: AC

## 2013-08-13 MED ORDER — FERROUS SULFATE 325 (65 FE) MG PO TABS: 325.0000 mg | ORAL_TABLET | Freq: Three times a day (TID) | ORAL | Status: AC

## 2013-08-13 MED ORDER — OXYCODONE HCL 5 MG PO TABS: 5.0000 mg | ORAL_TABLET | ORAL | Status: AC

## 2013-08-13 NOTE — Discharge Instructions (Signed)
Inpatient Rehab Discharge Instructions  Deaja Rizo Discharge date and time: No discharge date for patient encounter.   Activities/Precautions/ Functional Status: Activity: activity as tolerated Diet: regular diet Wound Care: keep wound clean and dry Functional status:  ___ No restrictions     ___ Walk up steps independently ___ 24/7 supervision/assistance   ___ Walk up steps with assistance ___ Intermittent supervision/assistance  ___ Bathe/dress independently ___ Walk with walker     ___ Bathe/dress with assistance ___ Walk Independently    ___ Shower independently _x__ Walk with assistance    ___ Shower with assistance ___ No alcohol     ___ Return to work/school ________   COMMUNITY REFERRALS UPON DISCHARGE:   Home Health:   PT     OT        Agency:  [facility] Phone:  [phone]  Medical Equipment/Items Ordered:  Wide rolling walker; drop arm commode; tub bench  Agency/Supplier:  [facility]          Phone:  [phone] Handicap placard application given - Mrs. Chambers must sign it, add her license number, and choose one or two placards.  Then, anyone can take it to the [facility] with $5 (or $10 for two) to obtain the actual placard.   Special Instructions:  Begin aspirin 325 mg twice daily x one month after XARELTO completed  My questions have been answered and I understand these instructions. I will adhere to these goals and the provided educational materials after my discharge from the hospital.  Patient/Caregiver Signature _______________________________ Date __________  Clinician Signature _______________________________________ Date __________  Please bring this form and your medication list with you to all your follow-up doctor's appointments.

## 2013-08-13 NOTE — Progress Notes (Addendum)
[facility]     PROGRESS NOTE    Subjective/Complaints: Feeling well. Anxious to get home..  Objective: Vital Signs: Blood pressure 129/71, pulse 99, temperature 98.5 F (36.9 C), temperature source Oral, resp. rate 20, height 5\' 10"  (1.778 m), weight 141.658 kg (312 lb 4.8 oz), SpO2 99.00%. No results found. No results found for this basename: WBC, HGB, HCT, PLT,  in the last 72 hours No results found for this basename: NA, K, CL, CO, GLUCOSE, BUN, CREATININE, CALCIUM,  in the last 72 hours CBG (last 3)  No results found for this basename: GLUCAP,  in the last 72 hours  Wt Readings from Last 3 Encounters:  08/11/13 141.658 kg (312 lb 4.8 oz)  08/02/13 131.999 kg (291 lb 0.1 oz)  08/02/13 131.999 kg (291 lb 0.1 oz)    Physical Exam:  Constitutional: She is oriented to person, place, and time.  HENT:  Head: Normocephalic.  Eyes: EOM are normal.  Neck: Normal range of motion. Neck supple. No thyromegaly present.  Cardiovascular: Normal rate and regular rhythm.  Respiratory: Effort normal and breath sounds normal. No respiratory distress.  GI: Soft. Bowel sounds are normal. She exhibits no distension.  Obese  Neurological: She is alert and oriented to person, place, and time.  Skin:  Bilateral knee incisions are dressed and appropriately tender. No erythema. Minimal dressingDorsi flexion plantar flexion intact  Motor strength is 5/5 bilateral deltoid, bicep, tricep, grip.  2 minus hip flexion 1-2/5  knee extension 4/5 ankle dorsiflexion plantar flexion  Sensory intact to light touch in bilateral upper and lower limbs  Musc: edema around knees, appropriate. Knees tender to palpation, ROM approximately 90 degrees in both knees to PROM Psych: a little anxious  Assessment/Plan: 1. Functional deficits secondary to OA of knees s/p bilateral TKA's which require 3+ hours per day of interdisciplinary therapy in a comprehensive inpatient rehab  setting. Physiatrist is providing close team supervision and 24 hour management of active medical problems listed below. Physiatrist and rehab team continue to assess barriers to discharge/monitor patient progress toward functional and medical goals.  Dc home today. Ortho follow up.   FIM: FIM - Bathing Bathing Steps Patient Completed: Chest;Right Arm;Left Arm;Abdomen;Front perineal area;Right upper leg;Left upper leg;Buttocks;Right lower leg (including foot);Left lower leg (including foot) Bathing: 6: Assistive device (Comment)  FIM - Upper Body Dressing/Undressing Upper body dressing/undressing steps patient completed: Thread/unthread right bra strap;Thread/unthread left bra strap;Thread/unthread right sleeve of pullover shirt/dresss;Thread/unthread left sleeve of pullover shirt/dress;Put head through opening of pull over shirt/dress;Pull shirt over trunk;Hook/unhook bra Upper body dressing/undressing: 7: Complete Independence: No helper FIM - Lower Body Dressing/Undressing Lower body dressing/undressing steps patient completed: Thread/unthread right underwear leg;Thread/unthread left underwear leg;Thread/unthread right pants leg;Thread/unthread left pants leg;Don/Doff right sock;Don/Doff left sock;Pull underwear up/down;Pull pants up/down;Don/Doff right shoe;Don/Doff left shoe;Fasten/unfasten right shoe;Fasten/unfasten left shoe Lower body dressing/undressing: 6: Assistive device (Comment)  FIM - Toileting Toileting steps completed by patient: Adjust clothing prior to toileting;Performs perineal hygiene;Adjust clothing after toileting Toileting Assistive Devices: Grab bar or rail for support Toileting: 6: Assistive device: No helper  FIM - Radio producer Devices: Elevated toilet seat;Grab bars;Walker Toilet Transfers: 6-Assistive device: No helper  FIM - Control and instrumentation engineer Devices: Environmental consultant;Arm rests Bed/Chair Transfer: 6: Supine  > Sit: No assist;6: Sit > Supine: No assist;6: Bed > Chair or W/C: No assist;6: Chair or W/C > Bed: No assist  FIM - Locomotion: Wheelchair Distance: 150 Locomotion: Wheelchair: 6: Travels 150  ft or more, turns around, maneuvers to table, bed or toilet, negotiates 3% grade: maneuvers on rugs and over door sills independently FIM - Locomotion: Ambulation Locomotion: Ambulation Assistive Devices: Walker - Rolling Ambulation/Gait Assistance: 6: Modified independent (Device/Increase time) Locomotion: Ambulation: 6: Travels 150 ft or more with assistive device/no helper  Comprehension Comprehension Mode: Auditory Comprehension: 6-Follows complex conversation/direction: With extra time/assistive device  Expression Expression Mode: Verbal Expression: 6-Expresses complex ideas: With extra time/assistive device  Social Interaction Social Interaction: 6-Interacts appropriately with others with medication or extra time (anti-anxiety, antidepressant).  Problem Solving Problem Solving: 6-Solves complex problems: With extra time  Memory Memory: 7-Complete Independence: No helper  Medical Problem List and Plan:  1. 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 DVT Prophylaxis/Anticoagulation: Xarelto.  3. Pain Management: Oxycodone and Robaxin as needed. Monitor with increased mobility   -ms contin trial 15mg  q12----can wean off over one month time  -using less breakthrough pain  -scheduled robaxin 4. acute blood loss anemia. Continue iron supplement. Followup CBC  5. Neuropsych: This patient is capable of making decisions on her own behalf.   -a little anxious at times 6. Morbid obesity. BMI 41.15 kg. 7. GERD. Protonix  8. Uro: 100k ecoli  -continue macrobid for 7 day course  LOS (Days) 8 A FACE TO FACE EVALUATION WAS PERFORMED  Meredith Staggers 08/13/2013 8:09 AM

## 2013-08-13 NOTE — Progress Notes (Signed)
Pt was discharged home with family. Discharge instruction was given by Silvestre Mesi, PA

## 2013-08-30 ENCOUNTER — Ambulatory Visit: Payer: BC Managed Care – PPO | Attending: Orthopedic Surgery | Admitting: Physical Therapy

## 2013-08-30 DIAGNOSIS — R262 Difficulty in walking, not elsewhere classified: Secondary | ICD-10-CM | POA: Insufficient documentation

## 2013-08-30 DIAGNOSIS — IMO0001 Reserved for inherently not codable concepts without codable children: Secondary | ICD-10-CM | POA: Insufficient documentation

## 2013-08-30 DIAGNOSIS — M25569 Pain in unspecified knee: Secondary | ICD-10-CM | POA: Insufficient documentation

## 2013-08-30 DIAGNOSIS — M25669 Stiffness of unspecified knee, not elsewhere classified: Secondary | ICD-10-CM | POA: Insufficient documentation

## 2013-08-30 DIAGNOSIS — R609 Edema, unspecified: Secondary | ICD-10-CM | POA: Insufficient documentation

## 2013-09-01 ENCOUNTER — Ambulatory Visit: Payer: BC Managed Care – PPO | Admitting: Physical Therapy

## 2013-09-06 ENCOUNTER — Ambulatory Visit: Payer: BC Managed Care – PPO | Admitting: Physical Therapy

## 2013-09-08 ENCOUNTER — Ambulatory Visit: Payer: BC Managed Care – PPO | Admitting: Physical Therapy

## 2013-09-10 ENCOUNTER — Ambulatory Visit: Payer: BC Managed Care – PPO | Admitting: Physical Therapy

## 2013-09-14 ENCOUNTER — Ambulatory Visit: Payer: BC Managed Care – PPO | Admitting: Physical Therapy

## 2013-09-15 ENCOUNTER — Ambulatory Visit: Payer: BC Managed Care – PPO | Attending: Orthopedic Surgery | Admitting: Physical Therapy

## 2013-09-15 DIAGNOSIS — IMO0001 Reserved for inherently not codable concepts without codable children: Secondary | ICD-10-CM | POA: Insufficient documentation

## 2013-09-15 DIAGNOSIS — M25569 Pain in unspecified knee: Secondary | ICD-10-CM | POA: Insufficient documentation

## 2013-09-15 DIAGNOSIS — M25669 Stiffness of unspecified knee, not elsewhere classified: Secondary | ICD-10-CM | POA: Insufficient documentation

## 2013-09-15 DIAGNOSIS — R262 Difficulty in walking, not elsewhere classified: Secondary | ICD-10-CM | POA: Insufficient documentation

## 2013-09-15 DIAGNOSIS — R609 Edema, unspecified: Secondary | ICD-10-CM | POA: Insufficient documentation

## 2013-09-16 ENCOUNTER — Ambulatory Visit: Payer: BC Managed Care – PPO | Admitting: Physical Therapy

## 2013-09-20 ENCOUNTER — Ambulatory Visit: Payer: BC Managed Care – PPO | Admitting: Physical Therapy

## 2013-09-22 ENCOUNTER — Ambulatory Visit: Payer: BC Managed Care – PPO | Admitting: Physical Therapy

## 2013-09-23 ENCOUNTER — Ambulatory Visit: Payer: BC Managed Care – PPO | Admitting: Physical Therapy

## 2013-09-27 ENCOUNTER — Ambulatory Visit: Payer: BC Managed Care – PPO | Admitting: Physical Therapy

## 2013-09-29 ENCOUNTER — Ambulatory Visit: Payer: BC Managed Care – PPO | Admitting: Physical Therapy

## 2013-10-01 ENCOUNTER — Ambulatory Visit: Payer: BC Managed Care – PPO | Admitting: Physical Therapy

## 2013-10-04 ENCOUNTER — Ambulatory Visit: Payer: BC Managed Care – PPO | Admitting: Physical Therapy

## 2013-10-06 ENCOUNTER — Ambulatory Visit: Payer: BC Managed Care – PPO | Admitting: Physical Therapy

## 2013-10-08 ENCOUNTER — Ambulatory Visit: Payer: BC Managed Care – PPO | Admitting: Physical Therapy

## 2013-10-11 ENCOUNTER — Ambulatory Visit: Payer: BC Managed Care – PPO | Admitting: Physical Therapy

## 2013-10-13 ENCOUNTER — Ambulatory Visit: Payer: BC Managed Care – PPO | Admitting: Physical Therapy

## 2013-10-14 ENCOUNTER — Ambulatory Visit: Payer: BC Managed Care – PPO | Admitting: Physical Therapy

## 2013-10-19 ENCOUNTER — Ambulatory Visit: Payer: BC Managed Care – PPO | Attending: Orthopedic Surgery | Admitting: Physical Therapy

## 2013-10-19 DIAGNOSIS — M25669 Stiffness of unspecified knee, not elsewhere classified: Secondary | ICD-10-CM | POA: Insufficient documentation

## 2013-10-19 DIAGNOSIS — IMO0001 Reserved for inherently not codable concepts without codable children: Secondary | ICD-10-CM | POA: Diagnosis present

## 2013-10-19 DIAGNOSIS — M25569 Pain in unspecified knee: Secondary | ICD-10-CM | POA: Insufficient documentation

## 2013-10-19 DIAGNOSIS — R609 Edema, unspecified: Secondary | ICD-10-CM | POA: Insufficient documentation

## 2013-10-19 DIAGNOSIS — R262 Difficulty in walking, not elsewhere classified: Secondary | ICD-10-CM | POA: Insufficient documentation

## 2013-10-21 ENCOUNTER — Ambulatory Visit: Payer: BC Managed Care – PPO | Admitting: Physical Therapy

## 2013-10-21 DIAGNOSIS — IMO0001 Reserved for inherently not codable concepts without codable children: Secondary | ICD-10-CM | POA: Diagnosis not present

## 2013-10-22 ENCOUNTER — Ambulatory Visit: Payer: BC Managed Care – PPO | Admitting: Physical Therapy

## 2013-10-22 DIAGNOSIS — IMO0001 Reserved for inherently not codable concepts without codable children: Secondary | ICD-10-CM | POA: Diagnosis not present

## 2013-10-26 ENCOUNTER — Ambulatory Visit: Payer: BC Managed Care – PPO | Admitting: Physical Therapy

## 2013-10-28 ENCOUNTER — Ambulatory Visit: Payer: BC Managed Care – PPO | Admitting: Physical Therapy

## 2013-10-28 DIAGNOSIS — IMO0001 Reserved for inherently not codable concepts without codable children: Secondary | ICD-10-CM | POA: Diagnosis not present

## 2013-11-01 ENCOUNTER — Ambulatory Visit: Payer: BC Managed Care – PPO | Admitting: Physical Therapy

## 2013-11-01 DIAGNOSIS — IMO0001 Reserved for inherently not codable concepts without codable children: Secondary | ICD-10-CM | POA: Diagnosis not present

## 2013-11-04 ENCOUNTER — Ambulatory Visit: Payer: BC Managed Care – PPO | Admitting: Physical Therapy

## 2013-11-11 ENCOUNTER — Ambulatory Visit: Payer: BC Managed Care – PPO | Admitting: Physical Therapy

## 2013-11-11 DIAGNOSIS — IMO0001 Reserved for inherently not codable concepts without codable children: Secondary | ICD-10-CM | POA: Diagnosis not present

## 2013-11-15 ENCOUNTER — Ambulatory Visit: Payer: BC Managed Care – PPO | Admitting: Physical Therapy

## 2013-11-15 DIAGNOSIS — IMO0001 Reserved for inherently not codable concepts without codable children: Secondary | ICD-10-CM | POA: Diagnosis not present

## 2013-11-18 ENCOUNTER — Ambulatory Visit: Payer: BC Managed Care – PPO | Attending: Orthopedic Surgery | Admitting: Physical Therapy

## 2013-11-18 DIAGNOSIS — IMO0001 Reserved for inherently not codable concepts without codable children: Secondary | ICD-10-CM | POA: Diagnosis not present

## 2013-11-18 DIAGNOSIS — M25669 Stiffness of unspecified knee, not elsewhere classified: Secondary | ICD-10-CM | POA: Diagnosis not present

## 2013-11-18 DIAGNOSIS — R609 Edema, unspecified: Secondary | ICD-10-CM | POA: Insufficient documentation

## 2013-11-18 DIAGNOSIS — R262 Difficulty in walking, not elsewhere classified: Secondary | ICD-10-CM | POA: Diagnosis not present

## 2013-11-18 DIAGNOSIS — M25569 Pain in unspecified knee: Secondary | ICD-10-CM | POA: Insufficient documentation

## 2023-04-24 ENCOUNTER — Encounter: Payer: Self-pay | Admitting: Physical Therapy

## 2023-04-24 ENCOUNTER — Other Ambulatory Visit: Payer: Self-pay

## 2023-04-24 ENCOUNTER — Ambulatory Visit: Payer: 59 | Attending: Physician Assistant | Admitting: Physical Therapy

## 2023-04-24 DIAGNOSIS — M6281 Muscle weakness (generalized): Secondary | ICD-10-CM

## 2023-04-24 DIAGNOSIS — M25512 Pain in left shoulder: Secondary | ICD-10-CM | POA: Diagnosis present

## 2023-04-24 DIAGNOSIS — G8929 Other chronic pain: Secondary | ICD-10-CM | POA: Diagnosis present

## 2023-04-24 DIAGNOSIS — M5459 Other low back pain: Secondary | ICD-10-CM

## 2023-04-24 DIAGNOSIS — R29898 Other symptoms and signs involving the musculoskeletal system: Secondary | ICD-10-CM

## 2023-04-24 NOTE — Therapy (Signed)
 OUTPATIENT PHYSICAL THERAPY THORACOLUMBAR EVALUATION   Patient Name: [name] MRN: [id number] DOB:1971-02-10, 53 y.o., female Today's Date: 04/24/2023  END OF SESSION:  PT End of Session - 04/24/23 1353     Visit Number 1    Number of Visits 9    Date for PT Re-Evaluation 06/19/23    Authorization Type [employer]na state    Authorization Time Period 04/24/23 to 06/19/23    PT Start Time 1303    PT Stop Time 1345    PT Time Calculation (min) 42 min    Activity Tolerance Patient tolerated treatment well    Behavior During Therapy WFL for tasks assessed/performed             Past Medical History:  Diagnosis Date   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   Anemia    hx of   Arthritis    Enlarged thyroid    all tests are normal   GERD (gastroesophageal reflux disease)    with motrin   Heart murmur    Obesity, Class III, BMI 40-49.9 (morbid obesity) (HCC)    Pneumonia    hx of   Sleep apnea    Past Surgical History:  Procedure Laterality Date   ABDOMINAL HYSTERECTOMY  03/2003   partial   CHOLECYSTECTOMY  2012   LIPOMA EXCISION  01/11/2010   TOTAL KNEE ARTHROPLASTY Bilateral 08/02/2013   Procedure: TOTAL KNEE BILATERAL;  Surgeon: Donnice JONETTA Car, MD;  Location: [facility];  Service: Orthopedics;  Laterality: Bilateral;   TUBAL LIGATION     Patient Active Problem List   Diagnosis Date Noted   Status post total bilateral knee replacement 08/05/2013   Expected blood loss anemia 08/04/2013   S/P bilateral TKA 08/02/2013   Morbid obesity (HCC) 10/12/2010   Hypertension 10/12/2010   RUQ PAIN 05/04/2010   EPIGASTRIC PAIN 05/04/2010    PCP: Jacques Camie Pepper PA-C   REFERRING PROVIDER: Donata Snowman, PA-C  REFERRING DIAG: Free Text Diagnosis M54.41 Rt Side Low back pain w/ Sciatica  Rationale for Evaluation and Treatment: Rehabilitation  THERAPY DIAG:  Other low back pain  Muscle weakness (generalized)  Other symptoms and signs involving the musculoskeletal  system  Chronic left shoulder pain  ONSET DATE: 2 weeks ago this past Sunday   SUBJECTIVE:                                                                                                                                                                                           SUBJECTIVE STATEMENT:  Here with some back pain, very active, working with multiple trainers and doing zumba and riding my bike  quite a lot at home. Woke up with pain that Sunday night, thought I pulled my HS but MD said I needed to be seen. Having some butt pain and pain when I sit a lot at school. Really want to find a way to stay active, I won't stop exercising. Hadn't done anything new, had done some body weight exercises at home that Saturday and Sunday. Some weeks I do only 5 days of exercise, most of the time I do 6-7 days of exercise, rest days are not common. Doing 2 days of weight training with trainer, then 2 days at home and the rest is cardio. 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   PERTINENT HISTORY:   See above   PAIN:  Are you having pain? Yes: NPRS scale: 1/10  Pain location: L butt cheek now  Pain description: ache/barely there  Aggravating factors: sitting  Relieving factors: movement, not sitting   PRECAUTIONS: None  RED FLAGS: None   WEIGHT BEARING RESTRICTIONS: No  FALLS:  Has patient fallen in last 6 months? No  LIVING ENVIRONMENT: Lives with: lives with their spouse   OCCUPATION: runner, broadcasting/film/video- first grade   PLOF: Independent, Independent with basic ADLs, Independent with gait, and Independent with transfers  PATIENT GOALS: be able exercise without this flaring up, address shoulder if we can   NEXT MD VISIT: not scheduled   OBJECTIVE:  Note: Objective measures were completed at Evaluation unless otherwise noted.  DIAGNOSTIC FINDINGS:     PATIENT SURVEYS:  Modified Oswestry 10%   COGNITION: Overall cognitive status: Within functional limits for tasks assessed     SENSATION: Not tested  MUSCLE LENGTH:  Hamstrings mod limitations B Piriformis mod limitations B, L>R  L LE about 3/4 cm shorter than R LE  POSTURE: rounded shoulders, forward head, and increased thoracic kyphosis    LUMBAR ROM:   AROM eval  Flexion WNL; RFIS   Extension Mild limitation, REIS   Right lateral flexion Mild limitation   Left lateral flexion Mild limitation   Right rotation Mod limitation   Left rotation Mod limitation    (Blank rows = not tested)    LOWER EXTREMITY MMT:    MMT Right eval Left eval  Hip flexion 4+ 4-  Hip extension    Hip abduction 5 4+  Hip adduction    Hip internal rotation    Hip external rotation    Knee flexion 5 5  Knee extension 5 5  Ankle dorsiflexion 5 5  Ankle plantarflexion    Ankle inversion    Ankle eversion     (Blank rows = not tested)  LUMBAR SPECIAL TESTS:  Straight leg raise test: Negative Sciatic flossing negative B      TREATMENT DATE:    04/24/23  Exam, care planning, HEP   SKTC 6x5 seconds B Lumbar 6x5 seconds B QL stretch from chair 1x30 seconds B  PATIENT EDUCATION:  Education details: exam findings, POC, HEP  Person educated: Patient Education method: Programmer, Multimedia, Demonstration, Verbal cues, and Handouts Education comprehension: verbalized understanding, returned demonstration, verbal cues required, and needs further education  HOME EXERCISE PROGRAM: Access Code: [email or web address] URL: [email or web address] Date: 04/24/2023 Prepared by: Josette Rough  Exercises - Supine Single Knee to Chest Stretch  - 2 x daily - 7 x weekly - 1 sets - 6 reps - 5 seconds  hold - Supine Lower Trunk Rotation  - 2 x daily - 7 x  weekly - 1 sets - 6 reps - 5 seconds  hold - Seated Quadratus Lumborum Stretch in Chair  - 2 x daily - 7 x weekly - 1 sets - 2 reps - 30 seconds  hold  ASSESSMENT:  CLINICAL IMPRESSION: Patient is a 53 y.o. F who was seen today for physical therapy evaluation and treatment for  Free Text Diagnosis  M54.41 Rt Side Low back pain w/ Sciatica  . Objectives as above, I do think that her level of activity without taking many days for recovery or flexibility is likely contributing to symptoms, we discussed value of rest days in detail. She also has some complaints about her shoulder, we can work this up while she is in PT as well.   OBJECTIVE IMPAIRMENTS: decreased activity tolerance, decreased ROM, decreased strength, increased fascial restrictions, increased muscle spasms, postural dysfunction, obesity, and pain.   ACTIVITY LIMITATIONS: carrying, lifting, sitting, standing, squatting, and locomotion level  PARTICIPATION LIMITATIONS: meal prep, cleaning, laundry, community activity, occupation, and yard work  PERSONAL FACTORS: Age, Behavior pattern, Education, Fitness, Past/current experiences, and Time since onset of injury/illness/exacerbation are also affecting patient's functional outcome.   REHAB POTENTIAL: Good  CLINICAL DECISION MAKING: Stable/uncomplicated  EVALUATION COMPLEXITY: Low   GOALS: Goals reviewed with patient? No  SHORT TERM GOALS: Target date: 05/22/2023    Will be compliant with appropriate progressive HEP  Baseline: Goal status: INITIAL  2.  Lumbar ROM to have normalized and will be painfree  Baseline:  Goal status: INITIAL  3.  LE mm flexibility to be no more than 25% limited  Baseline:  Goal status: INITIAL  4.  Shoulder w/u to have been completed and HEP given  Baseline:  Goal status: INITIAL    LONG TERM GOALS: Target date: 06/19/2023    MMT in all weak groups to have improved to 5/5  Baseline:  Goal status: INITIAL  2.  Will be compliant with  scheduled rest days along with regular exercise schedule  Baseline:  Goal status: INITIAL  3.  Pain to have resolved in shoulder and back/hips  Baseline:  Goal status: INITIAL  4.  Oswestry to be 0% limited  Baseline:  Goal status: INITIAL  5.  Will be able to perform all desired functional tasks in gym and at home without difficulty  Baseline:  Goal status: INITIAL    PLAN:  PT FREQUENCY: 1-2x/week  PT DURATION: 8 weeks  PLANNED INTERVENTIONS: 97164- PT Re-evaluation, 97110-Therapeutic exercises, 97530- Therapeutic activity, 97535- Self Care, 02859- Manual therapy, 97760- Orthotic Fit/training, [id number]- Ionotophoresis 4mg /ml Dexamethasone , Taping, Dry Needling, Cryotherapy, and Moist heat.  PLAN FOR NEXT SESSION: place heel lift if in stock, work on lumbar and hip mobility and core strength , w/u shoulder and treat as per POC   Josette Rough, PT, DPT 04/24/23 2:35 PM

## 2023-05-14 ENCOUNTER — Ambulatory Visit: Payer: 59

## 2023-05-20 NOTE — Therapy (Signed)
 OUTPATIENT PHYSICAL THERAPY THORACOLUMBAR TREATMENT   Patient Name: [name] MRN: [id number] DOB:[birth year], 53 y.o., female Today's Date: 05/21/2023  END OF SESSION:  PT End of Session - 05/21/23 1656     Visit Number 2    Number of Visits 9    Date for PT Re-Evaluation 06/19/23    Authorization Type [employer]na state    Authorization Time Period 04/24/23 to 06/19/23    PT Start Time 1700    PT Stop Time 1745    PT Time Calculation (min) 45 min    Activity Tolerance Patient tolerated treatment well    Behavior During Therapy WFL for tasks assessed/performed              Past Medical History:  Diagnosis Date   Adrenal tumor    "monitoring"   Anemia    hx of   Arthritis    Enlarged thyroid    "all tests are normal"   GERD (gastroesophageal reflux disease)    with motrin   Heart murmur    Obesity, Class III, BMI 40-49.9 (morbid obesity) (HCC)    Pneumonia    hx of   Sleep apnea    Past Surgical History:  Procedure Laterality Date   ABDOMINAL HYSTERECTOMY  03/2003   partial   CHOLECYSTECTOMY  2012   LIPOMA EXCISION  01/11/2010   TOTAL KNEE ARTHROPLASTY Bilateral 08/02/2013   Procedure: TOTAL KNEE BILATERAL;  Surgeon: Shelda Pal, MD;  Location: [facility];  Service: Orthopedics;  Laterality: Bilateral;   TUBAL LIGATION     Patient Active Problem List   Diagnosis Date Noted   Status post total bilateral knee replacement 08/05/2013   Expected blood loss anemia 08/04/2013   S/P bilateral TKA 08/02/2013   Morbid obesity (HCC) 10/12/2010   Hypertension 10/12/2010   RUQ PAIN 05/04/2010   EPIGASTRIC PAIN 05/04/2010    PCP: Dani Gobble PA-C   REFERRING PROVIDER: Julien Girt, PA-C  REFERRING DIAG: Free Text Diagnosis M54.41 Rt Side Low back pain w/ Sciatica  Rationale for Evaluation and Treatment: Rehabilitation  THERAPY DIAG:  Other low back pain  Other symptoms and signs involving the musculoskeletal system  Muscle weakness  (generalized)  Chronic left shoulder pain  ONSET DATE: 2 weeks ago this past Sunday   SUBJECTIVE:                                                                                                                                                                                           SUBJECTIVE STATEMENT:  I have had several injuries and pains the last week. I had tendonitis and bursitis in the  R side, I went back to Ortho. They told me to rest, and I have been. Exercising 6 days a week and 1 rest day.   PERTINENT HISTORY:   See above   PAIN:  Are you having pain? Yes: NPRS scale: 1/10  Pain location: L shoulder, R knee  Pain description: ache/barely there  Aggravating factors: sitting  Relieving factors: movement, not sitting   PRECAUTIONS: None  RED FLAGS: None   WEIGHT BEARING RESTRICTIONS: No  FALLS:  Has patient fallen in last 6 months? No  LIVING ENVIRONMENT: Lives with: lives with their spouse   OCCUPATION: Runner, broadcasting/film/video- first grade   PLOF: Independent, Independent with basic ADLs, Independent with gait, and Independent with transfers  PATIENT GOALS: be able exercise without this flaring up, address shoulder if we can   NEXT MD VISIT: not scheduled   OBJECTIVE:  Note: Objective measures were completed at Evaluation unless otherwise noted.  DIAGNOSTIC FINDINGS:    PATIENT SURVEYS:  Modified Oswestry 10%   COGNITION: Overall cognitive status: Within functional limits for tasks assessed     SENSATION: Not tested  MUSCLE LENGTH:  Hamstrings mod limitations B Piriformis mod limitations B, L>R  L LE about 3/4 cm shorter than R LE  POSTURE: rounded shoulders, forward head, and increased thoracic kyphosis    LUMBAR ROM:   AROM eval 05/21/23  Flexion WNL; RFIS  WNL  Extension Mild limitation, REIS  WNL  Right lateral flexion Mild limitation  WFL  Left lateral flexion Mild limitation  WFL  Right rotation Mod limitation  WNL  Left rotation Mod  limitation  WNL   (Blank rows = not tested)    LOWER EXTREMITY MMT:    MMT Right eval Left eval  Hip flexion 4+ 4-  Hip extension    Hip abduction 5 4+  Hip adduction    Hip internal rotation    Hip external rotation    Knee flexion 5 5  Knee extension 5 5  Ankle dorsiflexion 5 5  Ankle plantarflexion    Ankle inversion    Ankle eversion     (Blank rows = not tested)  LUMBAR SPECIAL TESTS:  Straight leg raise test: Negative Sciatic flossing negative B      TREATMENT DATE:  05/21/23 Recheck goals  NuStep L5x74mins Feet on pball rotations, knees to chest, small bridges x10 LE supine stretches SKTC, HS, IT band  SLR 2x10 AB roll up with ball 2x10  Modified deadbug with LE only 2x10 alternating    04/24/23  Exam, care planning, HEP   SKTC 6x5 seconds B Lumbar 6x5 seconds B QL stretch from chair 1x30 seconds B                                                                             PATIENT EDUCATION:  Education details: exam findings, POC, HEP  Person educated: Patient Education method: Programmer, multimedia, Demonstration, Verbal cues, and Handouts Education comprehension: verbalized understanding, returned demonstration, verbal cues required, and needs further education  HOME EXERCISE PROGRAM: Access Code: [email or web address] URL: [email or web address] Date: 04/24/2023 Prepared by: [name]  Exercises - Supine Single Knee to Chest Stretch  - 2 x daily - 7 x weekly - 1 sets -  6 reps - 5 seconds  hold - Supine Lower Trunk Rotation  - 2 x daily - 7 x weekly - 1 sets - 6 reps - 5 seconds  hold - Seated Quadratus Lumborum Stretch in Chair  - 2 x daily - 7 x weekly - 1 sets - 2 reps - 30 seconds  hold  ASSESSMENT:  CLINICAL IMPRESSION: Patient is a 53 y.o. F who was seen today for physical therapy treatment for LBP. This is her first visit since eval on 2/6. She is very active and pushes herself as she states she is trying to lose 60 more lbs. She has  been having some pains and aches mostly due to tendonitis and overuse. She was advised to take rest days but so far is only doing 1 day off per week. We worked mostly on mobility and core exercises today.   OBJECTIVE IMPAIRMENTS: decreased activity tolerance, decreased ROM, decreased strength, increased fascial restrictions, increased muscle spasms, postural dysfunction, obesity, and pain.   ACTIVITY LIMITATIONS: carrying, lifting, sitting, standing, squatting, and locomotion level  PARTICIPATION LIMITATIONS: meal prep, cleaning, laundry, community activity, occupation, and yard work  PERSONAL FACTORS: Age, Behavior pattern, Education, Fitness, Past/current experiences, and Time since onset of injury/illness/exacerbation are also affecting patient's functional outcome.   REHAB POTENTIAL: Good  CLINICAL DECISION MAKING: Stable/uncomplicated  EVALUATION COMPLEXITY: Low   GOALS: Goals reviewed with patient? No  SHORT TERM GOALS: Target date: 05/22/2023    Will be compliant with appropriate progressive HEP  Baseline: Goal status: MET 05/21/23  2.  Lumbar ROM to have normalized and will be painfree  Baseline:  Goal status: MET 05/21/23  3.  LE mm flexibility to be no more than 25% limited  Baseline:  Goal status: MET 05/21/23 "the HS issue is much better and I feel less tight"  4.  Shoulder w/u to have been completed and HEP given  Baseline:  Goal status: full ROM, 5/5 strength L shoulder    LONG TERM GOALS: Target date: 06/19/2023    MMT in all weak groups to have improved to 5/5  Baseline:  Goal status: INITIAL  2.  Will be compliant with scheduled rest days along with regular exercise schedule  Baseline:  Goal status: ongoing 05/21/23  3.  Pain to have resolved in shoulder and back/hips  Baseline:  Goal status: ongoing "lumbar pain once in a while" 05/21/23  4.  Oswestry to be 0% limited  Baseline:  Goal status: INITIAL  5.  Will be able to perform all desired functional  tasks in gym and at home without difficulty  Baseline:  Goal status: ongoing 05/21/23    PLAN:  PT FREQUENCY: 1-2x/week  PT DURATION: 8 weeks  PLANNED INTERVENTIONS: 97164- PT Re-evaluation, 97110-Therapeutic exercises, 97530- Therapeutic activity, 97535- Self Care, 09811- Manual therapy, 97760- Orthotic Fit/training, [id number]- Ionotophoresis 4mg /ml Dexamethasone, Taping, Dry Needling, Cryotherapy, and Moist heat.  PLAN FOR NEXT SESSION: place heel lift if in stock, work on lumbar and hip mobility and core strength , w/u shoulder and treat as per POC   [name], PT, DPT 05/21/23 5:42 PM

## 2023-05-21 ENCOUNTER — Ambulatory Visit: Payer: 59 | Attending: Physician Assistant

## 2023-05-21 DIAGNOSIS — M25512 Pain in left shoulder: Secondary | ICD-10-CM | POA: Diagnosis present

## 2023-05-21 DIAGNOSIS — M6281 Muscle weakness (generalized): Secondary | ICD-10-CM | POA: Diagnosis present

## 2023-05-21 DIAGNOSIS — M5459 Other low back pain: Secondary | ICD-10-CM | POA: Insufficient documentation

## 2023-05-21 DIAGNOSIS — R29898 Other symptoms and signs involving the musculoskeletal system: Secondary | ICD-10-CM | POA: Insufficient documentation

## 2023-05-21 DIAGNOSIS — G8929 Other chronic pain: Secondary | ICD-10-CM | POA: Insufficient documentation

## 2023-05-28 ENCOUNTER — Ambulatory Visit: Payer: 59

## 2023-05-28 DIAGNOSIS — R29898 Other symptoms and signs involving the musculoskeletal system: Secondary | ICD-10-CM

## 2023-05-28 DIAGNOSIS — M6281 Muscle weakness (generalized): Secondary | ICD-10-CM

## 2023-05-28 DIAGNOSIS — M5459 Other low back pain: Secondary | ICD-10-CM

## 2023-05-28 NOTE — Therapy (Signed)
 OUTPATIENT PHYSICAL THERAPY THORACOLUMBAR TREATMENT   Patient Name: [name] MRN: [id number] DOB:[birth year], 53 y.o., female Today's Date: 05/28/2023  END OF SESSION:  PT End of Session - 05/28/23 1701     Visit Number 3    Number of Visits 9    Date for PT Re-Evaluation 06/19/23    Authorization Type [employer] state    Authorization Time Period 04/24/23 to 06/19/23    PT Start Time 1700    PT Stop Time 1745    PT Time Calculation (min) 45 min    Activity Tolerance Patient tolerated treatment well    Behavior During Therapy WFL for tasks assessed/performed               Past Medical History:  Diagnosis Date   Adrenal tumor    "monitoring"   Anemia    hx of   Arthritis    Enlarged thyroid    "all tests are normal"   GERD (gastroesophageal reflux disease)    with motrin   Heart murmur    Obesity, Class III, BMI 40-49.9 (morbid obesity) (HCC)    Pneumonia    hx of   Sleep apnea    Past Surgical History:  Procedure Laterality Date   ABDOMINAL HYSTERECTOMY  03/2003   partial   CHOLECYSTECTOMY  2012   LIPOMA EXCISION  01/11/2010   TOTAL KNEE ARTHROPLASTY Bilateral 08/02/2013   Procedure: TOTAL KNEE BILATERAL;  Surgeon: Shelda Pal, MD;  Location: [facility];  Service: Orthopedics;  Laterality: Bilateral;   TUBAL LIGATION     Patient Active Problem List   Diagnosis Date Noted   Status post total bilateral knee replacement 08/05/2013   Expected blood loss anemia 08/04/2013   S/P bilateral TKA 08/02/2013   Morbid obesity (HCC) 10/12/2010   Hypertension 10/12/2010   RUQ PAIN 05/04/2010   EPIGASTRIC PAIN 05/04/2010    PCP: Dani Gobble PA-C   REFERRING PROVIDER: Julien Girt, PA-C  REFERRING DIAG: Free Text Diagnosis M54.41 Rt Side Low back pain w/ Sciatica  Rationale for Evaluation and Treatment: Rehabilitation  THERAPY DIAG:  Other low back pain  Other symptoms and signs involving the musculoskeletal system  Muscle weakness  (generalized)  ONSET DATE: 2 weeks ago this past Sunday   SUBJECTIVE:                                                                                                                                                                                           SUBJECTIVE STATEMENT: I am sore from working out. The good kind of sore.   PERTINENT HISTORY:   See above   PAIN:  Are you having pain? Yes: NPRS scale: 1/10  Pain location: L shoulder, R knee  Pain description: ache/barely there  Aggravating factors: sitting  Relieving factors: movement, not sitting   PRECAUTIONS: None  RED FLAGS: None   WEIGHT BEARING RESTRICTIONS: No  FALLS:  Has patient fallen in last 6 months? No  LIVING ENVIRONMENT: Lives with: lives with their spouse   OCCUPATION: Runner, broadcasting/film/video- first grade   PLOF: Independent, Independent with basic ADLs, Independent with gait, and Independent with transfers  PATIENT GOALS: be able exercise without this flaring up, address shoulder if we can   NEXT MD VISIT: not scheduled   OBJECTIVE:  Note: Objective measures were completed at Evaluation unless otherwise noted.  DIAGNOSTIC FINDINGS:    PATIENT SURVEYS:  Modified Oswestry 10%   COGNITION: Overall cognitive status: Within functional limits for tasks assessed     SENSATION: Not tested  MUSCLE LENGTH:  Hamstrings mod limitations B Piriformis mod limitations B, L>R  L LE about 3/4 cm shorter than R LE  POSTURE: rounded shoulders, forward head, and increased thoracic kyphosis    LUMBAR ROM:   AROM eval 05/21/23  Flexion WNL; RFIS  WNL  Extension Mild limitation, REIS  WNL  Right lateral flexion Mild limitation  WFL  Left lateral flexion Mild limitation  WFL  Right rotation Mod limitation  WNL  Left rotation Mod limitation  WNL   (Blank rows = not tested)    LOWER EXTREMITY MMT:    MMT Right eval Left eval  Hip flexion 4+ 4-  Hip extension    Hip abduction 5 4+  Hip adduction    Hip  internal rotation    Hip external rotation    Knee flexion 5 5  Knee extension 5 5  Ankle dorsiflexion 5 5  Ankle plantarflexion    Ankle inversion    Ankle eversion     (Blank rows = not tested)  LUMBAR SPECIAL TESTS:  Straight leg raise test: Negative Sciatic flossing negative B      TREATMENT DATE:  05/28/23 Walking outdoors Bike L4 x78mins  Shoulder ext 10# 2x10 AR press  blackTB crunches  Leg press  Modified crunches  Seated rotations with yellow ball    05/21/23 Recheck goals  NuStep L5x45mins Feet on pball rotations, knees to chest, small bridges x10 LE supine stretches SKTC, HS, IT band  SLR 2x10 AB roll up with ball 2x10  Modified deadbug with LE only 2x10 alternating    04/24/23  Exam, care planning, HEP   SKTC 6x5 seconds B Lumbar 6x5 seconds B QL stretch from chair 1x30 seconds B                                                                             PATIENT EDUCATION:  Education details: exam findings, POC, HEP  Person educated: Patient Education method: Programmer, multimedia, Demonstration, Verbal cues, and Handouts Education comprehension: verbalized understanding, returned demonstration, verbal cues required, and needs further education  HOME EXERCISE PROGRAM: Access Code: [email or web address] URL: [email or web address] Date: 04/24/2023 Prepared by: [name]  Exercises - Supine Single Knee to Chest Stretch  - 2 x daily - 7 x weekly - 1 sets - 6 reps - 5 seconds  hold - Supine Lower Trunk Rotation  - 2 x daily - 7 x weekly - 1 sets - 6 reps - 5 seconds  hold - Seated Quadratus Lumborum Stretch in Chair  - 2 x daily - 7 x weekly - 1 sets - 2 reps - 30 seconds  hold  ASSESSMENT:  CLINICAL IMPRESSION: Patient is a 53 y.o. F who was seen today for physical therapy treatment for LBP. She continues to be very active, attending multiple exercise classes weekly. Continued to work on some core and LE strengthening. Requires cues to slow down  and take rest breaks in between sets.   OBJECTIVE IMPAIRMENTS: decreased activity tolerance, decreased ROM, decreased strength, increased fascial restrictions, increased muscle spasms, postural dysfunction, obesity, and pain.   ACTIVITY LIMITATIONS: carrying, lifting, sitting, standing, squatting, and locomotion level  PARTICIPATION LIMITATIONS: meal prep, cleaning, laundry, community activity, occupation, and yard work  PERSONAL FACTORS: Age, Behavior pattern, Education, Fitness, Past/current experiences, and Time since onset of injury/illness/exacerbation are also affecting patient's functional outcome.   REHAB POTENTIAL: Good  CLINICAL DECISION MAKING: Stable/uncomplicated  EVALUATION COMPLEXITY: Low   GOALS: Goals reviewed with patient? No  SHORT TERM GOALS: Target date: 05/22/2023    Will be compliant with appropriate progressive HEP  Baseline: Goal status: MET 05/21/23  2.  Lumbar ROM to have normalized and will be painfree  Baseline:  Goal status: MET 05/21/23  3.  LE mm flexibility to be no more than 25% limited  Baseline:  Goal status: MET 05/21/23 "the HS issue is much better and I feel less tight"  4.  Shoulder w/u to have been completed and HEP given  Baseline:  Goal status: full ROM, 5/5 strength L shoulder    LONG TERM GOALS: Target date: 06/19/2023    MMT in all weak groups to have improved to 5/5  Baseline:  Goal status: INITIAL  2.  Will be compliant with scheduled rest days along with regular exercise schedule  Baseline:  Goal status: ongoing 05/21/23  3.  Pain to have resolved in shoulder and back/hips  Baseline:  Goal status: ongoing "lumbar pain once in a while" 05/21/23  4.  Oswestry to be 0% limited  Baseline:  Goal status: INITIAL  5.  Will be able to perform all desired functional tasks in gym and at home without difficulty  Baseline:  Goal status: ongoing 05/21/23    PLAN:  PT FREQUENCY: 1-2x/week  PT DURATION: 8 weeks  PLANNED  INTERVENTIONS: 97164- PT Re-evaluation, 97110-Therapeutic exercises, 97530- Therapeutic activity, 97535- Self Care, 81191- Manual therapy, 97760- Orthotic Fit/training, [id number]- Ionotophoresis 4mg /ml Dexamethasone, Taping, Dry Needling, Cryotherapy, and Moist heat.  PLAN FOR NEXT SESSION: place heel lift if in stock, work on lumbar and hip mobility and core strength , w/u shoulder and treat as per POC   [name], PT, DPT 05/28/23 5:44 PM

## 2023-06-03 NOTE — Therapy (Signed)
 OUTPATIENT PHYSICAL THERAPY THORACOLUMBAR TREATMENT   Patient Name: [name] MRN: [id number] DOB:09/12/1970, 53 y.o., female Today's Date: 06/04/2023  END OF SESSION:  PT End of Session - 06/04/23 1652     Visit Number 4    Number of Visits 9    Date for PT Re-Evaluation 06/19/23    Authorization Type [employer] state    Authorization Time Period 04/24/23 to 06/19/23    PT Start Time 1652    PT Stop Time 1735    PT Time Calculation (min) 43 min    Activity Tolerance Patient tolerated treatment well    Behavior During Therapy WFL for tasks assessed/performed                Past Medical History:  Diagnosis Date   Adrenal tumor    "monitoring"   Anemia    hx of   Arthritis    Enlarged thyroid    "all tests are normal"   GERD (gastroesophageal reflux disease)    with motrin   Heart murmur    Obesity, Class III, BMI 40-49.9 (morbid obesity) (HCC)    Pneumonia    hx of   Sleep apnea    Past Surgical History:  Procedure Laterality Date   ABDOMINAL HYSTERECTOMY  03/2003   partial   CHOLECYSTECTOMY  2012   LIPOMA EXCISION  01/11/2010   TOTAL KNEE ARTHROPLASTY Bilateral 08/02/2013   Procedure: TOTAL KNEE BILATERAL;  Surgeon: Shelda Pal, MD;  Location: [facility];  Service: Orthopedics;  Laterality: Bilateral;   TUBAL LIGATION     Patient Active Problem List   Diagnosis Date Noted   Status post total bilateral knee replacement 08/05/2013   Expected blood loss anemia 08/04/2013   S/P bilateral TKA 08/02/2013   Morbid obesity (HCC) 10/12/2010   Hypertension 10/12/2010   RUQ PAIN 05/04/2010   EPIGASTRIC PAIN 05/04/2010    PCP: Dani Gobble PA-C   REFERRING PROVIDER: Julien Girt, PA-C  REFERRING DIAG: Free Text Diagnosis M54.41 Rt Side Low back pain w/ Sciatica  Rationale for Evaluation and Treatment: Rehabilitation  THERAPY DIAG:  Other low back pain  Other symptoms and signs involving the musculoskeletal system  Muscle weakness  (generalized)  Chronic left shoulder pain  ONSET DATE: 2 weeks ago this past Sunday   SUBJECTIVE:                                                                                                                                                                                           SUBJECTIVE STATEMENT: I am okay. How do you know the difference between sciatica and sore muscles? My shoulder  is killing me. Some pain in the butt and hamstring.   PERTINENT HISTORY:   See above   PAIN:  Are you having pain? Yes: NPRS scale: 1/10  Pain location: L shoulder, R knee  Pain description: ache/barely there  Aggravating factors: sitting  Relieving factors: movement, not sitting   PRECAUTIONS: None  RED FLAGS: None   WEIGHT BEARING RESTRICTIONS: No  FALLS:  Has patient fallen in last 6 months? No  LIVING ENVIRONMENT: Lives with: lives with their spouse   OCCUPATION: Runner, broadcasting/film/video- first grade   PLOF: Independent, Independent with basic ADLs, Independent with gait, and Independent with transfers  PATIENT GOALS: be able exercise without this flaring up, address shoulder if we can   NEXT MD VISIT: not scheduled   OBJECTIVE:  Note: Objective measures were completed at Evaluation unless otherwise noted.  DIAGNOSTIC FINDINGS:    PATIENT SURVEYS:  Modified Oswestry 10%   COGNITION: Overall cognitive status: Within functional limits for tasks assessed     SENSATION: Not tested  MUSCLE LENGTH:  Hamstrings mod limitations B Piriformis mod limitations B, L>R  L LE about 3/4 cm shorter than R LE  POSTURE: rounded shoulders, forward head, and increased thoracic kyphosis    LUMBAR ROM:   AROM eval 05/21/23  Flexion WNL; RFIS  WNL  Extension Mild limitation, REIS  WNL  Right lateral flexion Mild limitation  WFL  Left lateral flexion Mild limitation  WFL  Right rotation Mod limitation  WNL  Left rotation Mod limitation  WNL   (Blank rows = not tested)    LOWER EXTREMITY  MMT:    MMT Right eval Left eval  Hip flexion 4+ 4-  Hip extension    Hip abduction 5 4+  Hip adduction    Hip internal rotation    Hip external rotation    Knee flexion 5 5  Knee extension 5 5  Ankle dorsiflexion 5 5  Ankle plantarflexion    Ankle inversion    Ankle eversion     (Blank rows = not tested)  LUMBAR SPECIAL TESTS:  Straight leg raise test: Negative Sciatic flossing negative B      TREATMENT DATE:  06/04/23 Treadmill walk on incline 2.5% x91mins UBE L1 x75mins  STM, manual TP release, some use of theragun, and stretching to L upper trap  UT stretch with 4# weight  Pec stretch in doorway 15s x2 Back ext with 15# cable 2x10  Hip hinge with 20# KB  05/28/23 Walking outdoors Bike L4 x26mins Shoulder ext 10# 2x10 AR press 10# 2x10 blackTB crunches 2x10 Leg press 40# 2x10 Modified crunches 2x10 Seated rotations with yellow ball 2x10 Standing holding ball in front marching    05/21/23 Recheck goals  NuStep L5x36mins Feet on pball rotations, knees to chest, small bridges x10 LE supine stretches SKTC, HS, IT band  SLR 2x10 AB roll up with ball 2x10  Modified deadbug with LE only 2x10 alternating    04/24/23  Exam, care planning, HEP   SKTC 6x5 seconds B Lumbar 6x5 seconds B QL stretch from chair 1x30 seconds B  PATIENT EDUCATION:  Education details: exam findings, POC, HEP  Person educated: Patient Education method: Programmer, multimedia, Demonstration, Verbal cues, and Handouts Education comprehension: verbalized understanding, returned demonstration, verbal cues required, and needs further education  HOME EXERCISE PROGRAM: Access Code: [email or web address] URL: [email or web address] Date: 04/24/2023 Prepared by: [name]  Exercises - Supine Single Knee to Chest Stretch  - 2 x daily - 7 x weekly - 1 sets - 6 reps - 5 seconds  hold - Supine Lower Trunk Rotation   - 2 x daily - 7 x weekly - 1 sets - 6 reps - 5 seconds  hold - Seated Quadratus Lumborum Stretch in Chair  - 2 x daily - 7 x weekly - 1 sets - 2 reps - 30 seconds  hold  ASSESSMENT:  CLINICAL IMPRESSION: Patient is a 54 y.o. F who was seen today for physical therapy treatment for LBP. Today she has more shoulder and upper trap pain. So we focused on some neck and shoulder stretching. She is very tight especially in L upper trap. With back extensions she reports it feels good and hits right in the lumbar spine where it needs to. She continues to be very active, attending multiple exercise classes weekly. However, she has a hard time differentiating between when she is doing too much and when she is not doing enough. Advised that if she has pain with exercises to stop or at least try to modify.    OBJECTIVE IMPAIRMENTS: decreased activity tolerance, decreased ROM, decreased strength, increased fascial restrictions, increased muscle spasms, postural dysfunction, obesity, and pain.   ACTIVITY LIMITATIONS: carrying, lifting, sitting, standing, squatting, and locomotion level  PARTICIPATION LIMITATIONS: meal prep, cleaning, laundry, community activity, occupation, and yard work  PERSONAL FACTORS: Age, Behavior pattern, Education, Fitness, Past/current experiences, and Time since onset of injury/illness/exacerbation are also affecting patient's functional outcome.   REHAB POTENTIAL: Good  CLINICAL DECISION MAKING: Stable/uncomplicated  EVALUATION COMPLEXITY: Low   GOALS: Goals reviewed with patient? No  SHORT TERM GOALS: Target date: 05/22/2023    Will be compliant with appropriate progressive HEP  Baseline: Goal status: MET 05/21/23  2.  Lumbar ROM to have normalized and will be painfree  Baseline:  Goal status: MET 05/21/23  3.  LE mm flexibility to be no more than 25% limited  Baseline:  Goal status: MET 05/21/23 "the HS issue is much better and I feel less tight"  4.  Shoulder w/u to  have been completed and HEP given  Baseline:  Goal status: full ROM, 5/5 strength L shoulder    LONG TERM GOALS: Target date: 06/19/2023    MMT in all weak groups to have improved to 5/5  Baseline:  Goal status: INITIAL  2.  Will be compliant with scheduled rest days along with regular exercise schedule  Baseline:  Goal status: ongoing 05/21/23  3.  Pain to have resolved in shoulder and back/hips  Baseline:  Goal status: ongoing "lumbar pain once in a while" 05/21/23  4.  Oswestry to be 0% limited  Baseline:  Goal status: INITIAL  5.  Will be able to perform all desired functional tasks in gym and at home without difficulty  Baseline:  Goal status: ongoing 05/21/23    PLAN:  PT FREQUENCY: 1-2x/week  PT DURATION: 8 weeks  PLANNED INTERVENTIONS: 97164- PT Re-evaluation, 97110-Therapeutic exercises, 97530- Therapeutic activity, 97535- Self Care, [id number]- Manual therapy, 97760- Orthotic Fit/training, [id number]- Ionotophoresis 4mg /ml Dexamethasone, Taping, Dry Needling, Cryotherapy, and Moist heat.  PLAN FOR NEXT SESSION: work on  lumbar and hip mobility and core strength, Birddogs  Reverse crunch [place] with blue ball Farmer's carry blackTB ext  [name], PT, DPT 06/04/23 5:37 PM

## 2023-06-04 ENCOUNTER — Ambulatory Visit: Payer: 59

## 2023-06-04 DIAGNOSIS — G8929 Other chronic pain: Secondary | ICD-10-CM

## 2023-06-04 DIAGNOSIS — R29898 Other symptoms and signs involving the musculoskeletal system: Secondary | ICD-10-CM

## 2023-06-04 DIAGNOSIS — M5459 Other low back pain: Secondary | ICD-10-CM | POA: Diagnosis not present

## 2023-06-04 DIAGNOSIS — M6281 Muscle weakness (generalized): Secondary | ICD-10-CM

## 2023-06-10 NOTE — Therapy (Incomplete)
 OUTPATIENT PHYSICAL THERAPY THORACOLUMBAR TREATMENT   Patient Name: [name] MRN: [id number] DOB:[birth year], 53 y.o., female Today's Date: 06/11/2023  END OF SESSION:  PT End of Session - 06/11/23 1657     Visit Number 5    Number of Visits 9    Date for PT Re-Evaluation 06/19/23    Authorization Type [employer] state    Authorization Time Period 04/24/23 to 06/19/23    PT Start Time 1657    PT Stop Time 1740    PT Time Calculation (min) 43 min    Activity Tolerance Patient tolerated treatment well    Behavior During Therapy WFL for tasks assessed/performed                 Past Medical History:  Diagnosis Date   Adrenal tumor    "monitoring"   Anemia    hx of   Arthritis    Enlarged thyroid    "all tests are normal"   GERD (gastroesophageal reflux disease)    with motrin   Heart murmur    Obesity, Class III, BMI 40-49.9 (morbid obesity) (HCC)    Pneumonia    hx of   Sleep apnea    Past Surgical History:  Procedure Laterality Date   ABDOMINAL HYSTERECTOMY  03/2003   partial   CHOLECYSTECTOMY  2012   LIPOMA EXCISION  01/11/2010   TOTAL KNEE ARTHROPLASTY Bilateral 08/02/2013   Procedure: TOTAL KNEE BILATERAL;  Surgeon: Shelda Pal, MD;  Location: [facility];  Service: Orthopedics;  Laterality: Bilateral;   TUBAL LIGATION     Patient Active Problem List   Diagnosis Date Noted   Status post total bilateral knee replacement 08/05/2013   Expected blood loss anemia 08/04/2013   S/P bilateral TKA 08/02/2013   Morbid obesity (HCC) 10/12/2010   Hypertension 10/12/2010   RUQ PAIN 05/04/2010   EPIGASTRIC PAIN 05/04/2010    PCP: Dani Gobble PA-C   REFERRING PROVIDER: Julien Girt, PA-C  REFERRING DIAG: Free Text Diagnosis M54.41 Rt Side Low back pain w/ Sciatica  Rationale for Evaluation and Treatment: Rehabilitation  THERAPY DIAG:  Chronic left shoulder pain  Muscle weakness (generalized)  Other symptoms and signs involving the  musculoskeletal system  Other low back pain  ONSET DATE: 2 weeks ago this past Sunday   SUBJECTIVE:                                                                                                                                                                                           SUBJECTIVE STATEMENT: Tired but I am okay. Went and got a massage to see if that would  help in the shoulder a little bit. I was hurting in class yesterday so I stopped. Having mild spasms still. Was really hurting Thursday and Friday where I was crying, massage was Saturday night.    PERTINENT HISTORY:   See above   PAIN:  Are you having pain? Yes: NPRS scale: 1/10  Pain location: L shoulder, R knee  Pain description: ache/barely there  Aggravating factors: sitting  Relieving factors: movement, not sitting   PRECAUTIONS: None  RED FLAGS: None   WEIGHT BEARING RESTRICTIONS: No  FALLS:  Has patient fallen in last 6 months? No  LIVING ENVIRONMENT: Lives with: lives with their spouse   OCCUPATION: Runner, broadcasting/film/video- first grade   PLOF: Independent, Independent with basic ADLs, Independent with gait, and Independent with transfers  PATIENT GOALS: be able exercise without this flaring up, address shoulder if we can   NEXT MD VISIT: not scheduled   OBJECTIVE:  Note: Objective measures were completed at Evaluation unless otherwise noted.  DIAGNOSTIC FINDINGS:    PATIENT SURVEYS:  Modified Oswestry 10%   COGNITION: Overall cognitive status: Within functional limits for tasks assessed     SENSATION: Not tested  MUSCLE LENGTH:  Hamstrings mod limitations B Piriformis mod limitations B, L>R  L LE about 3/4 cm shorter than R LE  POSTURE: rounded shoulders, forward head, and increased thoracic kyphosis    LUMBAR ROM:   AROM eval 05/21/23  Flexion WNL; RFIS  WNL  Extension Mild limitation, REIS  WNL  Right lateral flexion Mild limitation  WFL  Left lateral flexion Mild limitation  WFL   Right rotation Mod limitation  WNL  Left rotation Mod limitation  WNL   (Blank rows = not tested)    LOWER EXTREMITY MMT:    MMT Right eval Left eval  Hip flexion 4+ 4-  Hip extension    Hip abduction 5 4+  Hip adduction    Hip internal rotation    Hip external rotation    Knee flexion 5 5  Knee extension 5 5  Ankle dorsiflexion 5 5  Ankle plantarflexion    Ankle inversion    Ankle eversion     (Blank rows = not tested)  LUMBAR SPECIAL TESTS:  Straight leg raise test: Negative Sciatic flossing negative B      TREATMENT DATE:  06/11/23 NuStep L5x93mins  Shoulder extension 10# 2x10 Woodchops with yellow ball 2x10 Shoulder shrugs 2x10 Arm circles  UT stretch with 5# weight 30s  Trigger Point Dry Needling  Initial Treatment: Pt instructed on Dry Needling rational, procedures, and possible side effects. Pt instructed to expect mild to moderate muscle soreness later in the day and/or into the next day.  Pt instructed in methods to reduce muscle soreness. Pt instructed to continue prescribed HEP. Patient was educated on signs and symptoms of infection and other risk factors and advised to seek medical attention should they occur.  Patient verbalized understanding of these instructions and education.   Patient Verbal Consent Given: Yes Education Handout Provided: Yes Muscles Treated: bilateral upper traps Treatment Response/Outcome: LTR's Performed and documented by Octavio Graves, PT  06/04/23 Treadmill walk on incline 2.5% x92mins UBE L1 x84mins  STM, manual TP release, some use of theragun, and stretching to L upper trap  UT stretch with 4# weight  Pec stretch in doorway 15s x2 Back ext with 15# cable 2x10  Hip hinge with 20# KB  05/28/23 Walking outdoors Bike L4 x39mins Shoulder ext 10# 2x10 AR press 10# 2x10 blackTB  crunches 2x10 Leg press 40# 2x10 Modified crunches 2x10 Seated rotations with yellow ball 2x10 Standing holding ball in front marching     05/21/23 Recheck goals  NuStep L5x64mins Feet on pball rotations, knees to chest, small bridges x10 LE supine stretches SKTC, HS, IT band  SLR 2x10 AB roll up with ball 2x10  Modified deadbug with LE only 2x10 alternating    04/24/23  Exam, care planning, HEP   SKTC 6x5 seconds B Lumbar 6x5 seconds B QL stretch from chair 1x30 seconds B                                                                             PATIENT EDUCATION:  Education details: exam findings, POC, HEP  Person educated: Patient Education method: Programmer, multimedia, Demonstration, Verbal cues, and Handouts Education comprehension: verbalized understanding, returned demonstration, verbal cues required, and needs further education  HOME EXERCISE PROGRAM: Access Code: [email or web address] URL: [email or web address] Date: 04/24/2023 Prepared by: [name]  Exercises - Supine Single Knee to Chest Stretch  - 2 x daily - 7 x weekly - 1 sets - 6 reps - 5 seconds  hold - Supine Lower Trunk Rotation  - 2 x daily - 7 x weekly - 1 sets - 6 reps - 5 seconds  hold - Seated Quadratus Lumborum Stretch in Chair  - 2 x daily - 7 x weekly - 1 sets - 2 reps - 30 seconds  hold  ASSESSMENT:  CLINICAL IMPRESSION: Patient is a 53 y.o. F who was seen today for physical therapy treatment today for L shoulder and upper trap pain. We tried DN today to bilateral upper traps. She has noticeable trigger points and had good twitch responses with the needling. Followed with some light upper extremity exercises to prevent possible soreness.   OBJECTIVE IMPAIRMENTS: decreased activity tolerance, decreased ROM, decreased strength, increased fascial restrictions, increased muscle spasms, postural dysfunction, obesity, and pain.   ACTIVITY LIMITATIONS: carrying, lifting, sitting, standing, squatting, and locomotion level  PARTICIPATION LIMITATIONS: meal prep, cleaning, laundry, community activity, occupation, and yard work  PERSONAL  FACTORS: Age, Behavior pattern, Education, Fitness, Past/current experiences, and Time since onset of injury/illness/exacerbation are also affecting patient's functional outcome.   REHAB POTENTIAL: Good  CLINICAL DECISION MAKING: Stable/uncomplicated  EVALUATION COMPLEXITY: Low   GOALS: Goals reviewed with patient? No  SHORT TERM GOALS: Target date: 05/22/2023    Will be compliant with appropriate progressive HEP  Baseline: Goal status: MET 05/21/23  2.  Lumbar ROM to have normalized and will be painfree  Baseline:  Goal status: MET 05/21/23  3.  LE mm flexibility to be no more than 25% limited  Baseline:  Goal status: MET 05/21/23 "the HS issue is much better and I feel less tight"  4.  Shoulder w/u to have been completed and HEP given  Baseline:  Goal status: full ROM, 5/5 strength L shoulder    LONG TERM GOALS: Target date: 06/19/2023    MMT in all weak groups to have improved to 5/5  Baseline:  Goal status: INITIAL  2.  Will be compliant with scheduled rest days along with regular exercise schedule  Baseline:  Goal status: ongoing 05/21/23  3.  Pain to have  resolved in shoulder and back/hips  Baseline:  Goal status: ongoing "lumbar pain once in a while" 05/21/23  4.  Oswestry to be 0% limited  Baseline:  Goal status: INITIAL  5.  Will be able to perform all desired functional tasks in gym and at home without difficulty  Baseline:  Goal status: ongoing 05/21/23    PLAN:  PT FREQUENCY: 1-2x/week  PT DURATION: 8 weeks  PLANNED INTERVENTIONS: 97164- PT Re-evaluation, 97110-Therapeutic exercises, 97530- Therapeutic activity, 97535- Self Care, 16109- Manual therapy, 97760- Orthotic Fit/training, [id number]- Ionotophoresis 4mg /ml Dexamethasone, Taping, Dry Needling, Cryotherapy, and Moist heat.  PLAN FOR NEXT SESSION: work on lumbar and hip mobility and core strength,   Stacie Glaze, PT 06/11/23 5:44 PM

## 2023-06-11 ENCOUNTER — Ambulatory Visit: Payer: 59

## 2023-06-11 DIAGNOSIS — M5459 Other low back pain: Secondary | ICD-10-CM | POA: Diagnosis not present

## 2023-06-11 DIAGNOSIS — G8929 Other chronic pain: Secondary | ICD-10-CM

## 2023-06-11 DIAGNOSIS — M6281 Muscle weakness (generalized): Secondary | ICD-10-CM

## 2023-06-11 DIAGNOSIS — R29898 Other symptoms and signs involving the musculoskeletal system: Secondary | ICD-10-CM

## 2023-06-11 NOTE — Patient Instructions (Signed)

## 2023-06-17 NOTE — Therapy (Signed)
 OUTPATIENT PHYSICAL THERAPY THORACOLUMBAR TREATMENT   Patient Name: [name] MRN: [id number] DOB:12-19-1970, 53 y.o., female Today's Date: 06/18/2023  END OF SESSION:  PT End of Session - 06/18/23 1657     Visit Number 6    Number of Visits --    Date for PT Re-Evaluation 07/23/23    Authorization Type [employer] state    Authorization Time Period 06/18/23 to 07/23/23    PT Start Time 1700    PT Stop Time 1745    PT Time Calculation (min) 45 min    Activity Tolerance Patient tolerated treatment well    Behavior During Therapy WFL for tasks assessed/performed                  Past Medical History:  Diagnosis Date   Adrenal tumor    "monitoring"   Anemia    hx of   Arthritis    Enlarged thyroid    "all tests are normal"   GERD (gastroesophageal reflux disease)    with motrin   Heart murmur    Obesity, Class III, BMI 40-49.9 (morbid obesity) (HCC)    Pneumonia    hx of   Sleep apnea    Past Surgical History:  Procedure Laterality Date   ABDOMINAL HYSTERECTOMY  03/2003   partial   CHOLECYSTECTOMY  2012   LIPOMA EXCISION  01/11/2010   TOTAL KNEE ARTHROPLASTY Bilateral 08/02/2013   Procedure: TOTAL KNEE BILATERAL;  Surgeon: Shelda Pal, MD;  Location: [facility];  Service: Orthopedics;  Laterality: Bilateral;   TUBAL LIGATION     Patient Active Problem List   Diagnosis Date Noted   Status post total bilateral knee replacement 08/05/2013   Expected blood loss anemia 08/04/2013   S/P bilateral TKA 08/02/2013   Morbid obesity (HCC) 10/12/2010   Hypertension 10/12/2010   RUQ PAIN 05/04/2010   EPIGASTRIC PAIN 05/04/2010    PCP: Dani Gobble PA-C   REFERRING PROVIDER: Julien Girt, PA-C  REFERRING DIAG: Free Text Diagnosis M54.41 Rt Side Low back pain w/ Sciatica  Rationale for Evaluation and Treatment: Rehabilitation  THERAPY DIAG:  Chronic left shoulder pain  Other symptoms and signs involving the musculoskeletal system  ONSET DATE: 2  weeks ago this past Sunday   SUBJECTIVE:                                                                                                                                                                                           SUBJECTIVE STATEMENT: I was doing better but then the spasms started again on Monday. The needles helped, I was sore.   PERTINENT HISTORY:  See above   PAIN:  Are you having pain? Yes: NPRS scale: 1/10  Pain location: L shoulder, R knee  Pain description: ache/barely there  Aggravating factors: sitting  Relieving factors: movement, not sitting   PRECAUTIONS: None  RED FLAGS: None   WEIGHT BEARING RESTRICTIONS: No  FALLS:  Has patient fallen in last 6 months? No  LIVING ENVIRONMENT: Lives with: lives with their spouse   OCCUPATION: Runner, broadcasting/film/video- first grade   PLOF: Independent, Independent with basic ADLs, Independent with gait, and Independent with transfers  PATIENT GOALS: be able exercise without this flaring up, address shoulder if we can   NEXT MD VISIT: not scheduled   OBJECTIVE:  Note: Objective measures were completed at Evaluation unless otherwise noted.  DIAGNOSTIC FINDINGS:    PATIENT SURVEYS:  Modified Oswestry 10%   COGNITION: Overall cognitive status: Within functional limits for tasks assessed     SENSATION: Not tested  MUSCLE LENGTH:  Hamstrings mod limitations B Piriformis mod limitations B, L>R  L LE about 3/4 cm shorter than R LE  POSTURE: rounded shoulders, forward head, and increased thoracic kyphosis    LUMBAR ROM:   AROM eval 05/21/23  Flexion WNL; RFIS  WNL  Extension Mild limitation, REIS  WNL  Right lateral flexion Mild limitation  WFL  Left lateral flexion Mild limitation  WFL  Right rotation Mod limitation  WNL  Left rotation Mod limitation  WNL   (Blank rows = not tested)    LOWER EXTREMITY MMT:    MMT Right eval Left eval  Hip flexion 4+ 4-  Hip extension    Hip abduction 5 4+  Hip  adduction    Hip internal rotation    Hip external rotation    Knee flexion 5 5  Knee extension 5 5  Ankle dorsiflexion 5 5  Ankle plantarflexion    Ankle inversion    Ankle eversion     (Blank rows = not tested)  LUMBAR SPECIAL TESTS:  Straight leg raise test: Negative Sciatic flossing negative B      TREATMENT DATE:  06/18/23 UBE L3 x21mins each way   Trigger Point Dry Needling  Initial Treatment: Pt instructed on Dry Needling rational, procedures, and possible side effects. Pt instructed to expect mild to moderate muscle soreness later in the day and/or into the next day.  Pt instructed in methods to reduce muscle soreness. Pt instructed to continue prescribed HEP. Patient was educated on signs and symptoms of infection and other risk factors and advised to seek medical attention should they occur.  Patient verbalized understanding of these instructions and education.   Patient Verbal Consent Given: Yes Education Handout Provided: Yes Muscles Treated: bilateral upper traps, bilateral infraspinatus  Treatment Response/Outcome: LTR's Performed and documented by A Speaks, PT, DPT   Rotations with cable 10# 2x10 AR press 10# 2x10 Moist heat to L upper trap x8 mins    06/11/23 NuStep L5x46mins  Shoulder extension 10# 2x10 Woodchops with yellow ball 2x10 Shoulder shrugs 2x10 Arm circles  UT stretch with 5# weight 30s  Trigger Point Dry Needling  Initial Treatment: Pt instructed on Dry Needling rational, procedures, and possible side effects. Pt instructed to expect mild to moderate muscle soreness later in the day and/or into the next day.  Pt instructed in methods to reduce muscle soreness. Pt instructed to continue prescribed HEP. Patient was educated on signs and symptoms of infection and other risk factors and advised to seek medical attention should they occur.  Patient  verbalized understanding of these instructions and education.   Patient Verbal Consent Given:  Yes Education Handout Provided: Yes Muscles Treated: bilateral upper traps Treatment Response/Outcome: LTR's Performed and documented by Octavio Graves, PT  06/04/23 Treadmill walk on incline 2.5% x58mins UBE L1 x62mins  STM, manual TP release, some use of theragun, and stretching to L upper trap  UT stretch with 4# weight  Pec stretch in doorway 15s x2 Back ext with 15# cable 2x10  Hip hinge with 20# KB  05/28/23 Walking outdoors Bike L4 x40mins Shoulder ext 10# 2x10 AR press 10# 2x10 blackTB crunches 2x10 Leg press 40# 2x10 Modified crunches 2x10 Seated rotations with yellow ball 2x10 Standing holding ball in front marching    05/21/23 Recheck goals  NuStep L5x36mins Feet on pball rotations, knees to chest, small bridges x10 LE supine stretches SKTC, HS, IT band  SLR 2x10 AB roll up with ball 2x10  Modified deadbug with LE only 2x10 alternating    04/24/23  Exam, care planning, HEP   SKTC 6x5 seconds B Lumbar 6x5 seconds B QL stretch from chair 1x30 seconds B                                                                             PATIENT EDUCATION:  Education details: exam findings, POC, HEP  Person educated: Patient Education method: Programmer, multimedia, Demonstration, Verbal cues, and Handouts Education comprehension: verbalized understanding, returned demonstration, verbal cues required, and needs further education  HOME EXERCISE PROGRAM: Access Code: [email or web address] URL: [email or web address] Date: 04/24/2023 Prepared by: [name]  Exercises - Supine Single Knee to Chest Stretch  - 2 x daily - 7 x weekly - 1 sets - 6 reps - 5 seconds  hold - Supine Lower Trunk Rotation  - 2 x daily - 7 x weekly - 1 sets - 6 reps - 5 seconds  hold - Seated Quadratus Lumborum Stretch in Chair  - 2 x daily - 7 x weekly - 1 sets - 2 reps - 30 seconds  hold  ASSESSMENT:  CLINICAL IMPRESSION: Patient is a 53 y.o. F who was seen today for physical therapy treatment  today for L shoulder and upper trap pain. We tried DN again today to bilateral upper traps. She has noticeable trigger points and had good twitch responses with the needling. Followed with some light upper extremity and core exercises to prevent possible soreness.   OBJECTIVE IMPAIRMENTS: decreased activity tolerance, decreased ROM, decreased strength, increased fascial restrictions, increased muscle spasms, postural dysfunction, obesity, and pain.   ACTIVITY LIMITATIONS: carrying, lifting, sitting, standing, squatting, and locomotion level  PARTICIPATION LIMITATIONS: meal prep, cleaning, laundry, community activity, occupation, and yard work  PERSONAL FACTORS: Age, Behavior pattern, Education, Fitness, Past/current experiences, and Time since onset of injury/illness/exacerbation are also affecting patient's functional outcome.   REHAB POTENTIAL: Good  CLINICAL DECISION MAKING: Stable/uncomplicated  EVALUATION COMPLEXITY: Low   GOALS: Goals reviewed with patient? No  SHORT TERM GOALS: Target date: 05/22/2023    Will be compliant with appropriate progressive HEP  Baseline: Goal status: MET 05/21/23  2.  Lumbar ROM to have normalized and will be painfree  Baseline:  Goal status: MET 05/21/23  3.  LE mm flexibility  to be no more than 25% limited  Baseline:  Goal status: MET 05/21/23 "the HS issue is much better and I feel less tight"  4.  Shoulder w/u to have been completed and HEP given  Baseline:  Goal status: full ROM, 5/5 strength L shoulder    LONG TERM GOALS: Target date: 07/23/23    MMT in all weak groups to have improved to 5/5  Baseline:  Goal status: MET 06/18/23  2.  Will be compliant with scheduled rest days along with regular exercise schedule  Baseline:  Goal status: ongoing 05/21/23, MET 06/18/23  3.  Pain to have resolved in shoulder and back/hips  Baseline:  Goal status: ongoing "lumbar pain once in a while" 05/21/23, no pain in back or hamstrings, still having some  upper trap pain 06/18/23  4.  Oswestry to be 0% limited  Baseline:  Goal status: MET 06/18/23  5.  Will be able to perform all desired functional tasks in gym and at home without difficulty  Baseline:  Goal status: ongoing 05/21/23, ongoing 06/18/23    PLAN:  PT FREQUENCY: 1-2x/week  PT DURATION: 8 weeks  PLANNED INTERVENTIONS: 97164- PT Re-evaluation, 97110-Therapeutic exercises, 97530- Therapeutic activity, 97535- Self Care, 78469- Manual therapy, 97760- Orthotic Fit/training, [id number]- Ionotophoresis 4mg /ml Dexamethasone, Taping, Dry Needling, Cryotherapy, and Moist heat.  PLAN FOR NEXT SESSION: see how her shoulder/UT feels, Overhead carry 10#  Leg press, Ball slams blue ball    Cassie Freer, PT, DPT 06/18/23 5:46 PM

## 2023-06-18 ENCOUNTER — Ambulatory Visit: Attending: Orthopedic Surgery

## 2023-06-18 DIAGNOSIS — M6281 Muscle weakness (generalized): Secondary | ICD-10-CM | POA: Diagnosis present

## 2023-06-18 DIAGNOSIS — R29898 Other symptoms and signs involving the musculoskeletal system: Secondary | ICD-10-CM | POA: Insufficient documentation

## 2023-06-18 DIAGNOSIS — M5459 Other low back pain: Secondary | ICD-10-CM | POA: Diagnosis present

## 2023-06-18 DIAGNOSIS — M25512 Pain in left shoulder: Secondary | ICD-10-CM | POA: Insufficient documentation

## 2023-06-18 DIAGNOSIS — G8929 Other chronic pain: Secondary | ICD-10-CM | POA: Insufficient documentation

## 2023-07-03 ENCOUNTER — Ambulatory Visit: Admitting: Physical Therapy

## 2023-07-03 DIAGNOSIS — M5459 Other low back pain: Secondary | ICD-10-CM

## 2023-07-03 DIAGNOSIS — M6281 Muscle weakness (generalized): Secondary | ICD-10-CM

## 2023-07-03 DIAGNOSIS — M25512 Pain in left shoulder: Secondary | ICD-10-CM | POA: Diagnosis not present

## 2023-07-03 DIAGNOSIS — R29898 Other symptoms and signs involving the musculoskeletal system: Secondary | ICD-10-CM

## 2023-07-03 DIAGNOSIS — G8929 Other chronic pain: Secondary | ICD-10-CM

## 2023-07-03 NOTE — Therapy (Signed)
 OUTPATIENT PHYSICAL THERAPY THORACOLUMBAR TREATMENT   Patient Name: [name] MRN: [id number] DOB:[birth year], 53 y.o., female Today's Date: 07/03/2023  END OF SESSION:  PT End of Session - 07/03/23 1705     Visit Number 7    Number of Visits 9    Date for PT Re-Evaluation 07/23/23    Authorization Type Aetna state    Authorization Time Period 06/18/23 to 07/23/23    PT Start Time 1700    PT Stop Time 1745    PT Time Calculation (min) 45 min                  Past Medical History:  Diagnosis Date   Adrenal tumor    "monitoring"   Anemia    hx of   Arthritis    Enlarged thyroid    "all tests are normal"   GERD (gastroesophageal reflux disease)    with motrin   Heart murmur    Obesity, Class III, BMI 40-49.9 (morbid obesity) (HCC)    Pneumonia    hx of   Sleep apnea    Past Surgical History:  Procedure Laterality Date   ABDOMINAL HYSTERECTOMY  03/2003   partial   CHOLECYSTECTOMY  2012   LIPOMA EXCISION  01/11/2010   TOTAL KNEE ARTHROPLASTY Bilateral 08/02/2013   Procedure: TOTAL KNEE BILATERAL;  Surgeon: Shelda Pal, MD;  Location: [facility];  Service: Orthopedics;  Laterality: Bilateral;   TUBAL LIGATION     Patient Active Problem List   Diagnosis Date Noted   Status post total bilateral knee replacement 08/05/2013   Expected blood loss anemia 08/04/2013   S/P bilateral TKA 08/02/2013   Morbid obesity (HCC) 10/12/2010   Hypertension 10/12/2010   RUQ PAIN 05/04/2010   EPIGASTRIC PAIN 05/04/2010    PCP: Dani Gobble PA-C   REFERRING PROVIDER: Julien Girt, PA-C  REFERRING DIAG: Free Text Diagnosis M54.41 Rt Side Low back pain w/ Sciatica  Rationale for Evaluation and Treatment: Rehabilitation  THERAPY DIAG:  Chronic left shoulder pain  Other symptoms and signs involving the musculoskeletal system  Muscle weakness (generalized)  Other low back pain  ONSET DATE: 2 weeks ago this past Sunday   SUBJECTIVE:                                                                                                                                                                                            SUBJECTIVE STATEMENT: My low back is fine, but traps and shoulders are killing me.  PERTINENT HISTORY:   See above   PAIN:  Are you having pain? Yes: NPRS scale: 5/10 Lt shoulder  Pain location: L shoulder, R knee  Pain description: ache/barely there  Aggravating factors: sitting  Relieving factors: movement, not sitting   PRECAUTIONS: None  RED FLAGS: None   WEIGHT BEARING RESTRICTIONS: No  FALLS:  Has patient fallen in last 6 months? No  LIVING ENVIRONMENT: Lives with: lives with their spouse   OCCUPATION: Runner, broadcasting/film/video- first grade   PLOF: Independent, Independent with basic ADLs, Independent with gait, and Independent with transfers  PATIENT GOALS: be able exercise without this flaring up, address shoulder if we can   NEXT MD VISIT: not scheduled   OBJECTIVE:  Note: Objective measures were completed at Evaluation unless otherwise noted.  DIAGNOSTIC FINDINGS:    PATIENT SURVEYS:  Modified Oswestry 10%   COGNITION: Overall cognitive status: Within functional limits for tasks assessed     SENSATION: Not tested  MUSCLE LENGTH:  Hamstrings mod limitations B Piriformis mod limitations B, L>R  L LE about 3/4 cm shorter than R LE  POSTURE: rounded shoulders, forward head, and increased thoracic kyphosis    LUMBAR ROM:   AROM eval 05/21/23  Flexion WNL; RFIS  WNL  Extension Mild limitation, REIS  WNL  Right lateral flexion Mild limitation  WFL  Left lateral flexion Mild limitation  WFL  Right rotation Mod limitation  WNL  Left rotation Mod limitation  WNL   (Blank rows = not tested)    LOWER EXTREMITY MMT:    MMT Right eval Left eval  Hip flexion 4+ 4-  Hip extension    Hip abduction 5 4+  Hip adduction    Hip internal rotation    Hip external rotation    Knee flexion 5 5  Knee  extension 5 5  Ankle dorsiflexion 5 5  Ankle plantarflexion    Ankle inversion    Ankle eversion     (Blank rows = not tested)  LUMBAR SPECIAL TESTS:  Straight leg raise test: Negative Sciatic flossing negative B      TREATMENT DATE:  07/03/23 Massage gun upper traps - UBE 3 Min each way Yellow ball chest press 5# cable wood chopper 2x10 each side 6# shoulder shrug rolls fwd/back 2x10 each way Shoulder abd 5# cables x8, 4# db x10 - cue to engage core to avoid lateral bending  Cable shoulder ext 10# 2x10 Cable shoulder front raise 5# 2x10     06/18/23 UBE L3 x65mins each way   Trigger Point Dry Needling  Initial Treatment: Pt instructed on Dry Needling rational, procedures, and possible side effects. Pt instructed to expect mild to moderate muscle soreness later in the day and/or into the next day.  Pt instructed in methods to reduce muscle soreness. Pt instructed to continue prescribed HEP. Patient was educated on signs and symptoms of infection and other risk factors and advised to seek medical attention should they occur.  Patient verbalized understanding of these instructions and education.   Patient Verbal Consent Given: Yes Education Handout Provided: Yes Muscles Treated: bilateral upper traps, bilateral infraspinatus  Treatment Response/Outcome: LTR's Performed and documented by A Speaks, PT, DPT   Rotations with cable 10# 2x10 AR press 10# 2x10 Moist heat to L upper trap x8 mins    06/11/23 NuStep L5x57mins  Shoulder extension 10# 2x10 Woodchops with yellow ball 2x10 Shoulder shrugs 2x10 Arm circles  UT stretch with 5# weight 30s  Trigger Point Dry Needling  Initial Treatment: Pt instructed on Dry Needling rational, procedures, and possible side effects. Pt instructed to expect mild to moderate muscle soreness  later in the day and/or into the next day.  Pt instructed in methods to reduce muscle soreness. Pt instructed to continue prescribed  HEP. Patient was educated on signs and symptoms of infection and other risk factors and advised to seek medical attention should they occur.  Patient verbalized understanding of these instructions and education.   Patient Verbal Consent Given: Yes Education Handout Provided: Yes Muscles Treated: bilateral upper traps Treatment Response/Outcome: LTR's Performed and documented by Arminda Landmark, PT  06/04/23 Treadmill walk on incline 2.5% x21mins UBE L1 x55mins  STM, manual TP release, some use of theragun, and stretching to L upper trap  UT stretch with 4# weight  Pec stretch in doorway 15s x2 Back ext with 15# cable 2x10  Hip hinge with 20# KB  05/28/23 Walking outdoors Bike L4 x31mins Shoulder ext 10# 2x10 AR press 10# 2x10 blackTB crunches 2x10 Leg press 40# 2x10 Modified crunches 2x10 Seated rotations with yellow ball 2x10 Standing holding ball in front marching    05/21/23 Recheck goals  NuStep L5x45mins Feet on pball rotations, knees to chest, small bridges x10 LE supine stretches SKTC, HS, IT band  SLR 2x10 AB roll up with ball 2x10  Modified deadbug with LE only 2x10 alternating    04/24/23  Exam, care planning, HEP   SKTC 6x5 seconds B Lumbar 6x5 seconds B QL stretch from chair 1x30 seconds B                                                                             PATIENT EDUCATION:  Education details: exam findings, POC, HEP  Person educated: Patient Education method: Programmer, multimedia, Demonstration, Verbal cues, and Handouts Education comprehension: verbalized understanding, returned demonstration, verbal cues required, and needs further education  HOME EXERCISE PROGRAM: Access Code: [email or web address] URL: [email or web address] Date: 04/24/2023 Prepared by: Terrel Ferries  Exercises - Supine Single Knee to Chest Stretch  - 2 x daily - 7 x weekly - 1 sets - 6 reps - 5 seconds  hold - Supine Lower Trunk Rotation  - 2 x daily - 7 x weekly - 1 sets -  6 reps - 5 seconds  hold - Seated Quadratus Lumborum Stretch in Chair  - 2 x daily - 7 x weekly - 1 sets - 2 reps - 30 seconds  hold  ASSESSMENT:  CLINICAL IMPRESSION: Pt. arrived  with no LBP but her L shoulder and upper trap were bothering her. Couldn't get DN, but I used the massage gun to release some tension. She has good ROM but has pain with resisted ROM. She was educated on when to stop an exercises to avoid further injury. She was encouraged to continue her HEP.    Patient is a 53 y.o. F who was seen today for physical therapy treatment today for L shoulder and upper trap pain. We tried DN again today to bilateral upper traps. She has noticeable trigger points and had good twitch responses with the needling. Followed with some light upper extremity and core exercises to prevent possible soreness.   OBJECTIVE IMPAIRMENTS: decreased activity tolerance, decreased ROM, decreased strength, increased fascial restrictions, increased muscle spasms, postural dysfunction, obesity, and pain.   ACTIVITY LIMITATIONS: carrying, lifting, sitting,  standing, squatting, and locomotion level  PARTICIPATION LIMITATIONS: meal prep, cleaning, laundry, community activity, occupation, and yard work  PERSONAL FACTORS: Age, Behavior pattern, Education, Fitness, Past/current experiences, and Time since onset of injury/illness/exacerbation are also affecting patient's functional outcome.   REHAB POTENTIAL: Good  CLINICAL DECISION MAKING: Stable/uncomplicated  EVALUATION COMPLEXITY: Low   GOALS: Goals reviewed with patient? No  SHORT TERM GOALS: Target date: 05/22/2023    Will be compliant with appropriate progressive HEP  Baseline: Goal status: MET 05/21/23  2.  Lumbar ROM to have normalized and will be painfree  Baseline:  Goal status: MET 05/21/23  3.  LE mm flexibility to be no more than 25% limited  Baseline:  Goal status: MET 05/21/23 "the HS issue is much better and I feel less tight"  4.   Shoulder w/u to have been completed and HEP given  Baseline:  Goal status: full ROM, 5/5 strength L shoulder    LONG TERM GOALS: Target date: 07/23/23    MMT in all weak groups to have improved to 5/5  Baseline:  Goal status: MET 06/18/23  2.  Will be compliant with scheduled rest days along with regular exercise schedule  Baseline:  Goal status: ongoing 05/21/23, MET 06/18/23  3.  Pain to have resolved in shoulder and back/hips  Baseline:  Goal status: ongoing "lumbar pain once in a while" 05/21/23, no pain in back or hamstrings, still having some upper trap pain 06/18/23  4.  Oswestry to be 0% limited  Baseline:  Goal status: MET 06/18/23  5.  Will be able to perform all desired functional tasks in gym and at home without difficulty  Baseline:  Goal status: ongoing 05/21/23, ongoing 06/18/23    PLAN:  PT FREQUENCY: 1-2x/week  PT DURATION: 8 weeks  PLANNED INTERVENTIONS: 97164- PT Re-evaluation, 97110-Therapeutic exercises, 97530- Therapeutic activity, 97535- Self Care, 69629- Manual therapy, 97760- Orthotic Fit/training, [id number]- Ionotophoresis 4mg /ml Dexamethasone, Taping, Dry Needling, Cryotherapy, and Moist heat.  PLAN FOR NEXT SESSION: Progress with shoulder and UE strength.   Patient Details  Name: [name] MRN: [id number] Date of Birth: 10-Oct-1970 Referring Provider:  Jearlean Mince, [place]*  Encounter Date: 07/03/2023   Laurelyn Ponder 07/03/2023, 5:11 PM  [facility] [address] [facility] [place], [place], [address] Phone: [phone]   Fax:  [phone]

## 2023-07-10 ENCOUNTER — Ambulatory Visit: Admitting: Physical Therapy

## 2023-07-10 DIAGNOSIS — G8929 Other chronic pain: Secondary | ICD-10-CM

## 2023-07-10 DIAGNOSIS — R29898 Other symptoms and signs involving the musculoskeletal system: Secondary | ICD-10-CM

## 2023-07-10 DIAGNOSIS — M5459 Other low back pain: Secondary | ICD-10-CM

## 2023-07-10 DIAGNOSIS — M25512 Pain in left shoulder: Secondary | ICD-10-CM | POA: Diagnosis not present

## 2023-07-10 DIAGNOSIS — M6281 Muscle weakness (generalized): Secondary | ICD-10-CM

## 2023-07-10 NOTE — Therapy (Signed)
 OUTPATIENT PHYSICAL THERAPY THORACOLUMBAR TREATMENT   Patient Name: [name] MRN: [id number] DOB:04/07/1970, 53 y.o., female Today's Date: 07/10/2023  END OF SESSION:  PT End of Session - 07/10/23 1645     Visit Number 8    Number of Visits 9    Date for PT Re-Evaluation 07/23/23    Authorization Type Aetna state    Authorization Time Period 06/18/23 to 07/23/23    PT Start Time 1445    PT Stop Time 1535    PT Time Calculation (min) 50 min                  Past Medical History:  Diagnosis Date   Adrenal tumor    "monitoring"   Anemia    hx of   Arthritis    Enlarged thyroid    "all tests are normal"   GERD (gastroesophageal reflux disease)    with motrin   Heart murmur    Obesity, Class III, BMI 40-49.9 (morbid obesity) (HCC)    Pneumonia    hx of   Sleep apnea    Past Surgical History:  Procedure Laterality Date   ABDOMINAL HYSTERECTOMY  03/2003   partial   CHOLECYSTECTOMY  2012   LIPOMA EXCISION  01/11/2010   TOTAL KNEE ARTHROPLASTY Bilateral 08/02/2013   Procedure: TOTAL KNEE BILATERAL;  Surgeon: Bevin Bucks, MD;  Location: [facility];  Service: Orthopedics;  Laterality: Bilateral;   TUBAL LIGATION     Patient Active Problem List   Diagnosis Date Noted   Status post total bilateral knee replacement 08/05/2013   Expected blood loss anemia 08/04/2013   S/P bilateral TKA 08/02/2013   Morbid obesity (HCC) 10/12/2010   Hypertension 10/12/2010   RUQ PAIN 05/04/2010   EPIGASTRIC PAIN 05/04/2010    PCP: Jearlean Mince PA-C   REFERRING PROVIDER: Maryln Sober, PA-C  REFERRING DIAG: Free Text Diagnosis M54.41 Rt Side Low back pain w/ Sciatica  Rationale for Evaluation and Treatment: Rehabilitation  THERAPY DIAG:  Chronic left shoulder pain  Other symptoms and signs involving the musculoskeletal system  Muscle weakness (generalized)  Other low back pain  ONSET DATE: 2 weeks ago this past Sunday   SUBJECTIVE:                                                                                                                                                                                            SUBJECTIVE STATEMENT: Mostly good but I have had some days  PERTINENT HISTORY:   See above   PAIN:  Are you having pain? Yes: NPRS scale: 5/10 Lt shoulder  Pain location: L  shoulder, R knee  Pain description: ache/barely there  Aggravating factors: sitting  Relieving factors: movement, not sitting   PRECAUTIONS: None  RED FLAGS: None   WEIGHT BEARING RESTRICTIONS: No  FALLS:  Has patient fallen in last 6 months? No  LIVING ENVIRONMENT: Lives with: lives with their spouse   OCCUPATION: Runner, broadcasting/film/video- first grade   PLOF: Independent, Independent with basic ADLs, Independent with gait, and Independent with transfers  PATIENT GOALS: be able exercise without this flaring up, address shoulder if we can   NEXT MD VISIT: not scheduled   OBJECTIVE:  Note: Objective measures were completed at Evaluation unless otherwise noted.  DIAGNOSTIC FINDINGS:    PATIENT SURVEYS:  Modified Oswestry 10%   COGNITION: Overall cognitive status: Within functional limits for tasks assessed     SENSATION: Not tested  MUSCLE LENGTH:  Hamstrings mod limitations B Piriformis mod limitations B, L>R  L LE about 3/4 cm shorter than R LE  POSTURE: rounded shoulders, forward head, and increased thoracic kyphosis    LUMBAR ROM:   AROM eval 05/21/23  Flexion WNL; RFIS  WNL  Extension Mild limitation, REIS  WNL  Right lateral flexion Mild limitation  WFL  Left lateral flexion Mild limitation  WFL  Right rotation Mod limitation  WNL  Left rotation Mod limitation  WNL   (Blank rows = not tested)    LOWER EXTREMITY MMT:    MMT Right eval Left eval  Hip flexion 4+ 4-  Hip extension    Hip abduction 5 4+  Hip adduction    Hip internal rotation    Hip external rotation    Knee flexion 5 5  Knee extension 5 5  Ankle  dorsiflexion 5 5  Ankle plantarflexion    Ankle inversion    Ankle eversion     (Blank rows = not tested)  LUMBAR SPECIAL TESTS:  Straight leg raise test: Negative Sciatic flossing negative B      TREATMENT DATE:   07/10/23 Started with TP DN by MAlbright PT UBE L 4 2 min each way Shoulder ext 10#  2 sets 10 Cable row 15# 2x12 Wood chopper 10# x10 each ER 5# 2x10  7# shld shrug circles x10 each way  Blue band pull apart 2 sets 10 Massage gun upper traps -    07/03/23 Massage gun upper traps - UBE 3 Min each way Yellow ball chest press 5# cable wood chopper 2x10 each side 6# shoulder shrug rolls fwd/back 2x10 each way Shoulder abd 5# cables x8, 4# db x10 - cue to engage core to avoid lateral bending  Cable shoulder ext 10# 2x10 Cable shoulder front raise 5# 2x10     06/18/23 UBE L3 x49mins each way   Trigger Point Dry Needling  Initial Treatment: Pt instructed on Dry Needling rational, procedures, and possible side effects. Pt instructed to expect mild to moderate muscle soreness later in the day and/or into the next day.  Pt instructed in methods to reduce muscle soreness. Pt instructed to continue prescribed HEP. Patient was educated on signs and symptoms of infection and other risk factors and advised to seek medical attention should they occur.  Patient verbalized understanding of these instructions and education.   Patient Verbal Consent Given: Yes Education Handout Provided: Yes Muscles Treated: bilateral upper traps, bilateral infraspinatus  Treatment Response/Outcome: LTR's Performed and documented by A Speaks, PT, DPT   Rotations with cable 10# 2x10 AR press 10# 2x10 Moist heat to L upper trap x8 mins  06/11/23 NuStep L5x80mins  Shoulder extension 10# 2x10 Woodchops with yellow ball 2x10 Shoulder shrugs 2x10 Arm circles  UT stretch with 5# weight 30s  Trigger Point Dry Needling  Initial Treatment: Pt instructed on Dry Needling  rational, procedures, and possible side effects. Pt instructed to expect mild to moderate muscle soreness later in the day and/or into the next day.  Pt instructed in methods to reduce muscle soreness. Pt instructed to continue prescribed HEP. Patient was educated on signs and symptoms of infection and other risk factors and advised to seek medical attention should they occur.  Patient verbalized understanding of these instructions and education.   Patient Verbal Consent Given: Yes Education Handout Provided: Yes Muscles Treated: bilateral upper traps Treatment Response/Outcome: LTR's Performed and documented by Arminda Landmark, PT  06/04/23 Treadmill walk on incline 2.5% x63mins UBE L1 x61mins  STM, manual TP release, some use of theragun, and stretching to L upper trap  UT stretch with 4# weight  Pec stretch in doorway 15s x2 Back ext with 15# cable 2x10  Hip hinge with 20# KB  05/28/23 Walking outdoors Bike L4 x1mins Shoulder ext 10# 2x10 AR press 10# 2x10 blackTB crunches 2x10 Leg press 40# 2x10 Modified crunches 2x10 Seated rotations with yellow ball 2x10 Standing holding ball in front marching    05/21/23 Recheck goals  NuStep L5x8mins Feet on pball rotations, knees to chest, small bridges x10 LE supine stretches SKTC, HS, IT band  SLR 2x10 AB roll up with ball 2x10  Modified deadbug with LE only 2x10 alternating    04/24/23  Exam, care planning, HEP   SKTC 6x5 seconds B Lumbar 6x5 seconds B QL stretch from chair 1x30 seconds B                                                                             PATIENT EDUCATION:  Education details: exam findings, POC, HEP  Person educated: Patient Education method: Programmer, multimedia, Demonstration, Verbal cues, and Handouts Education comprehension: verbalized understanding, returned demonstration, verbal cues required, and needs further education  HOME EXERCISE PROGRAM: Access Code: [email or web address] URL:  [email or web address] Date: 04/24/2023 Prepared by: Terrel Ferries  Exercises - Supine Single Knee to Chest Stretch  - 2 x daily - 7 x weekly - 1 sets - 6 reps - 5 seconds  hold - Supine Lower Trunk Rotation  - 2 x daily - 7 x weekly - 1 sets - 6 reps - 5 seconds  hold - Seated Quadratus Lumborum Stretch in Chair  - 2 x daily - 7 x weekly - 1 sets - 2 reps - 30 seconds  hold  ASSESSMENT:  CLINICAL IMPRESSION: Pt arrived stating she is getting better overall but still has her days. Relief with DN and STW. Assessed and documented goals. Progressed func strengthening and continue to educ pt on active rest and not over doing as she is very active at gym. OBJECTIVE IMPAIRMENTS: decreased activity tolerance, decreased ROM, decreased strength, increased fascial restrictions, increased muscle spasms, postural dysfunction, obesity, and pain.   ACTIVITY LIMITATIONS: carrying, lifting, sitting, standing, squatting, and locomotion level  PARTICIPATION LIMITATIONS: meal prep, cleaning, laundry, community activity, occupation, and yard work  PERSONAL FACTORS: Age, Behavior pattern,  Education, Fitness, Past/current experiences, and Time since onset of injury/illness/exacerbation are also affecting patient's functional outcome.   REHAB POTENTIAL: Good  CLINICAL DECISION MAKING: Stable/uncomplicated  EVALUATION COMPLEXITY: Low   GOALS: Goals reviewed with patient? No  SHORT TERM GOALS: Target date: 05/22/2023    Will be compliant with appropriate progressive HEP  Baseline: Goal status: MET 05/21/23  2.  Lumbar ROM to have normalized and will be painfree  Baseline:  Goal status: MET 05/21/23  3.  LE mm flexibility to be no more than 25% limited  Baseline:  Goal status: MET 05/21/23 "the HS issue is much better and I feel less tight"  4.  Shoulder w/u to have been completed and HEP given  Baseline:  Goal status: full ROM, 5/5 strength L shoulder  MET    LONG TERM GOALS: Target  date: 07/23/23    MMT in all weak groups to have improved to 5/5  Baseline:  Goal status: MET 06/18/23  2.  Will be compliant with scheduled rest days along with regular exercise schedule  Baseline:  Goal status: ongoing 05/21/23, MET 06/18/23  3.  Pain to have resolved in shoulder and back/hips  Baseline:  Goal status: ongoing "lumbar pain once in a while" 05/21/23, no pain in back or hamstrings, still having some upper trap pain 06/18/23   07/10/23 progressing and resolving  4.  Oswestry to be 0% limited  Baseline:  Goal status: MET 06/18/23  5.  Will be able to perform all desired functional tasks in gym and at home without difficulty  Baseline:  Goal status: ongoing 05/21/23, ongoing 06/18/23   progressing 07/10/23    PLAN:  PT FREQUENCY: 1-2x/week  PT DURATION: 8 weeks  PLANNED INTERVENTIONS: 97164- PT Re-evaluation, 97110-Therapeutic exercises, 97530- Therapeutic activity, 97535- Self Care, 04540- Manual therapy, 97760- Orthotic Fit/training, [id number]- Ionotophoresis 4mg /ml Dexamethasone , Taping, Dry Needling, Cryotherapy, and Moist heat.  PLAN FOR NEXT SESSION: Progress with shoulder and UE strength.   Patient Details  Name: Madesyn Ast MRN: [id number] Date of Birth: 06-18-1970 Referring Provider:  Claiborne Crew, MD  Encounter Date: 07/10/2023   Aquilla Bayley, PTA 07/10/2023, 4:46 PM  [facility] [address] [place], [place], [address] Phone: [phone]   Fax:  [phone] at [facility] [address] [facility] [address] [place], [place], [address] Phone: [phone]   Fax:  [phone]  Patient Details  Name: Fannye Myer MRN: [id number] Date of Birth: 1971-01-21 Referring Provider:  Claiborne Crew, MD  Encounter Date: 07/10/2023   Aquilla Bayley, PTA 07/10/2023, 4:46 PM  [facility] [address] [facility] [place],  [place], [address] Phone: [phone]   Fax:  [phone]

## 2023-07-15 NOTE — Therapy (Signed)
 OUTPATIENT PHYSICAL THERAPY THORACOLUMBAR TREATMENT   Patient Name: [name] MRN: [id number] DOB:[birth year], 53 y.o., female Today's Date: 07/16/2023  END OF SESSION:  PT End of Session - 07/16/23 1702     Visit Number 9    Number of Visits --    Date for PT Re-Evaluation 07/23/23    Authorization Type Aetna state    Authorization Time Period 06/18/23 to 07/23/23    PT Start Time 1700    PT Stop Time 1745    PT Time Calculation (min) 45 min                   Past Medical History:  Diagnosis Date   Adrenal tumor    "monitoring"   Anemia    hx of   Arthritis    Enlarged thyroid    "all tests are normal"   GERD (gastroesophageal reflux disease)    with motrin   Heart murmur    Obesity, Class III, BMI 40-49.9 (morbid obesity) (HCC)    Pneumonia    hx of   Sleep apnea    Past Surgical History:  Procedure Laterality Date   ABDOMINAL HYSTERECTOMY  03/2003   partial   CHOLECYSTECTOMY  2012   LIPOMA EXCISION  01/11/2010   TOTAL KNEE ARTHROPLASTY Bilateral 08/02/2013   Procedure: TOTAL KNEE BILATERAL;  Surgeon: Bevin Bucks, MD;  Location: [facility];  Service: Orthopedics;  Laterality: Bilateral;   TUBAL LIGATION     Patient Active Problem List   Diagnosis Date Noted   Status post total bilateral knee replacement 08/05/2013   Expected blood loss anemia 08/04/2013   S/P bilateral TKA 08/02/2013   Morbid obesity (HCC) 10/12/2010   Hypertension 10/12/2010   RUQ PAIN 05/04/2010   EPIGASTRIC PAIN 05/04/2010    PCP: Jearlean Mince PA-C   REFERRING PROVIDER: Maryln Sober, PA-C  REFERRING DIAG: Free Text Diagnosis M54.41 Rt Side Low back pain w/ Sciatica  Rationale for Evaluation and Treatment: Rehabilitation  THERAPY DIAG:  Chronic left shoulder pain  Other symptoms and signs involving the musculoskeletal system  Muscle weakness (generalized)  ONSET DATE: 2 weeks ago this past Sunday   SUBJECTIVE:                                                                                                                                                                                            SUBJECTIVE STATEMENT: Still hurting but it is better. Have not been hurting today but it does hurt if I walk.   PERTINENT HISTORY:   See above   PAIN:  Are you having pain? Yes: NPRS scale:  4/10 Lt shoulder  Pain location: L shoulder, R knee  Pain description: ache/barely there  Aggravating factors: sitting  Relieving factors: movement, not sitting   PRECAUTIONS: None  RED FLAGS: None   WEIGHT BEARING RESTRICTIONS: No  FALLS:  Has patient fallen in last 6 months? No  LIVING ENVIRONMENT: Lives with: lives with their spouse   OCCUPATION: Runner, broadcasting/film/video- first grade   PLOF: Independent, Independent with basic ADLs, Independent with gait, and Independent with transfers  PATIENT GOALS: be able exercise without this flaring up, address shoulder if we can   NEXT MD VISIT: not scheduled   OBJECTIVE:  Note: Objective measures were completed at Evaluation unless otherwise noted.  DIAGNOSTIC FINDINGS:    PATIENT SURVEYS:  Modified Oswestry 10%   COGNITION: Overall cognitive status: Within functional limits for tasks assessed     SENSATION: Not tested  MUSCLE LENGTH:  Hamstrings mod limitations B Piriformis mod limitations B, L>R  L LE about 3/4 cm shorter than R LE  POSTURE: rounded shoulders, forward head, and increased thoracic kyphosis    LUMBAR ROM:   AROM eval 05/21/23  Flexion WNL; RFIS  WNL  Extension Mild limitation, REIS  WNL  Right lateral flexion Mild limitation  WFL  Left lateral flexion Mild limitation  WFL  Right rotation Mod limitation  WNL  Left rotation Mod limitation  WNL   (Blank rows = not tested)    LOWER EXTREMITY MMT:    MMT Right eval Left eval  Hip flexion 4+ 4-  Hip extension    Hip abduction 5 4+  Hip adduction    Hip internal rotation    Hip external rotation    Knee flexion 5 5   Knee extension 5 5  Ankle dorsiflexion 5 5  Ankle plantarflexion    Ankle inversion    Ankle eversion     (Blank rows = not tested)  LUMBAR SPECIAL TESTS:  Straight leg raise test: Negative Sciatic flossing negative B      TREATMENT DATE:  07/16/23 UBE L3 x17mins each way  Wall push ups x10  Seated row 25# 2x10 Lat pull downs 25# 2x10 Cable front raise 5# 2x10 Cable lateral raise 5# 2x10 Single arm lat pull with cable 5# 2x10 Taping to L shoulder- 3 pieces of tape to unload sore shoulder, no pull at anchors, 100% pull in the middle   07/10/23 Started with TP DN by MAlbright PT UBE L 4 2 min each way Shoulder ext 10#  2 sets 10 Cable row 15# 2x12 Wood chopper 10# x10 each ER 5# 2x10  7# shld shrug circles x10 each way  Blue band pull apart 2 sets 10 Massage gun upper traps -    07/03/23 Massage gun upper traps - UBE 3 Min each way Yellow ball chest press 5# cable wood chopper 2x10 each side 6# shoulder shrug rolls fwd/back 2x10 each way Shoulder abd 5# cables x8, 4# db x10 - cue to engage core to avoid lateral bending  Cable shoulder ext 10# 2x10 Cable shoulder front raise 5# 2x10     06/18/23 UBE L3 x63mins each way   Trigger Point Dry Needling  Initial Treatment: Pt instructed on Dry Needling rational, procedures, and possible side effects. Pt instructed to expect mild to moderate muscle soreness later in the day and/or into the next day.  Pt instructed in methods to reduce muscle soreness. Pt instructed to continue prescribed HEP. Patient was educated on signs and symptoms of infection and other risk  factors and advised to seek medical attention should they occur.  Patient verbalized understanding of these instructions and education.   Patient Verbal Consent Given: Yes Education Handout Provided: Yes Muscles Treated: bilateral upper traps, bilateral infraspinatus  Treatment Response/Outcome: LTR's Performed and documented by A Speaks, PT, DPT    Rotations with cable 10# 2x10 AR press 10# 2x10 Moist heat to L upper trap x8 mins    06/11/23 NuStep L5x92mins  Shoulder extension 10# 2x10 Woodchops with yellow ball 2x10 Shoulder shrugs 2x10 Arm circles  UT stretch with 5# weight 30s  Trigger Point Dry Needling  Initial Treatment: Pt instructed on Dry Needling rational, procedures, and possible side effects. Pt instructed to expect mild to moderate muscle soreness later in the day and/or into the next day.  Pt instructed in methods to reduce muscle soreness. Pt instructed to continue prescribed HEP. Patient was educated on signs and symptoms of infection and other risk factors and advised to seek medical attention should they occur.  Patient verbalized understanding of these instructions and education.   Patient Verbal Consent Given: Yes Education Handout Provided: Yes Muscles Treated: bilateral upper traps Treatment Response/Outcome: LTR's Performed and documented by Arminda Landmark, PT  06/04/23 Treadmill walk on incline 2.5% x50mins UBE L1 x37mins  STM, manual TP release, some use of theragun, and stretching to L upper trap  UT stretch with 4# weight  Pec stretch in doorway 15s x2 Back ext with 15# cable 2x10  Hip hinge with 20# KB  05/28/23 Walking outdoors Bike L4 x55mins Shoulder ext 10# 2x10 AR press 10# 2x10 blackTB crunches 2x10 Leg press 40# 2x10 Modified crunches 2x10 Seated rotations with yellow ball 2x10 Standing holding ball in front marching    05/21/23 Recheck goals  NuStep L5x39mins Feet on pball rotations, knees to chest, small bridges x10 LE supine stretches SKTC, HS, IT band  SLR 2x10 AB roll up with ball 2x10  Modified deadbug with LE only 2x10 alternating    04/24/23  Exam, care planning, HEP   SKTC 6x5 seconds B Lumbar 6x5 seconds B QL stretch from chair 1x30 seconds B                                                                             PATIENT EDUCATION:  Education  details: exam findings, POC, HEP  Person educated: Patient Education method: Programmer, multimedia, Demonstration, Verbal cues, and Handouts Education comprehension: verbalized understanding, returned demonstration, verbal cues required, and needs further education  HOME EXERCISE PROGRAM: Access Code: [email or web address] URL: [email or web address] Date: 04/24/2023 Prepared by: Terrel Ferries  Exercises - Supine Single Knee to Chest Stretch  - 2 x daily - 7 x weekly - 1 sets - 6 reps - 5 seconds  hold - Supine Lower Trunk Rotation  - 2 x daily - 7 x weekly - 1 sets - 6 reps - 5 seconds  hold - Seated Quadratus Lumborum Stretch in Chair  - 2 x daily - 7 x weekly - 1 sets - 2 reps - 30 seconds  hold  ASSESSMENT:  CLINICAL IMPRESSION: Pt arrived stating she is getting better overall but still having some pain in L shoulder and upper trap. She reports pain when walking.  I tried taping today at end of visit to unload the shoulder and provide some relief. Does well with shoulder strengthening intervention today.   OBJECTIVE IMPAIRMENTS: decreased activity tolerance, decreased ROM, decreased strength, increased fascial restrictions, increased muscle spasms, postural dysfunction, obesity, and pain.   ACTIVITY LIMITATIONS: carrying, lifting, sitting, standing, squatting, and locomotion level  PARTICIPATION LIMITATIONS: meal prep, cleaning, laundry, community activity, occupation, and yard work  PERSONAL FACTORS: Age, Behavior pattern, Education, Fitness, Past/current experiences, and Time since onset of injury/illness/exacerbation are also affecting patient's functional outcome.   REHAB POTENTIAL: Good  CLINICAL DECISION MAKING: Stable/uncomplicated  EVALUATION COMPLEXITY: Low   GOALS: Goals reviewed with patient? No  SHORT TERM GOALS: Target date: 05/22/2023    Will be compliant with appropriate progressive HEP  Baseline: Goal status: MET 05/21/23  2.  Lumbar ROM to have normalized and will  be painfree  Baseline:  Goal status: MET 05/21/23  3.  LE mm flexibility to be no more than 25% limited  Baseline:  Goal status: MET 05/21/23 "the HS issue is much better and I feel less tight"  4.  Shoulder w/u to have been completed and HEP given  Baseline:  Goal status: full ROM, 5/5 strength L shoulder  MET    LONG TERM GOALS: Target date: 07/23/23    MMT in all weak groups to have improved to 5/5  Baseline:  Goal status: MET 06/18/23  2.  Will be compliant with scheduled rest days along with regular exercise schedule  Baseline:  Goal status: ongoing 05/21/23, MET 06/18/23  3.  Pain to have resolved in shoulder and back/hips  Baseline:  Goal status: ongoing "lumbar pain once in a while" 05/21/23, no pain in back or hamstrings, still having some upper trap pain 06/18/23   07/10/23 progressing and resolving  4.  Oswestry to be 0% limited  Baseline:  Goal status: MET 06/18/23  5.  Will be able to perform all desired functional tasks in gym and at home without difficulty  Baseline:  Goal status: ongoing 05/21/23, ongoing 06/18/23   progressing 07/10/23    PLAN:  PT FREQUENCY: 1-2x/week  PT DURATION: 8 weeks  PLANNED INTERVENTIONS: 97164- PT Re-evaluation, 97110-Therapeutic exercises, 97530- Therapeutic activity, 97535- Self Care, 13086- Manual therapy, 97760- Orthotic Fit/training, [id number]- Ionotophoresis 4mg /ml Dexamethasone , Taping, Dry Needling, Cryotherapy, and Moist heat.  PLAN FOR NEXT SESSION: 10th visit progress note and recert if goals are not met and if she wants to continue    Patient Details  Name: [name] MRN: [id number] Date of Birth: 05-22-1970 Referring Provider:  Claiborne Crew, MD  Encounter Date: 07/16/2023   Donavon Fudge, PT 07/16/2023, 5:50 PM  [facility] [place] [facility] [address] [facility] [place], [place], [address] Phone: [phone]   Fax:  [phone] [address] [address] [facility] [address]. [place], [place], [address] Phone: [phone]   Fax:  [phone]  Patient Details  Name: [name] MRN: [id number] Date of Birth: [birth year] Referring Provider:  Claiborne Crew, MD  Encounter Date: 07/16/2023   Donavon Fudge, PT 07/16/2023, 5:50 PM  [place]  [facility] at [facility] [address] [facility]. [place], [place], [address] Phone: [phone]   Fax:  [phone]

## 2023-07-16 ENCOUNTER — Ambulatory Visit

## 2023-07-16 DIAGNOSIS — R29898 Other symptoms and signs involving the musculoskeletal system: Secondary | ICD-10-CM

## 2023-07-16 DIAGNOSIS — M25512 Pain in left shoulder: Secondary | ICD-10-CM | POA: Diagnosis not present

## 2023-07-16 DIAGNOSIS — G8929 Other chronic pain: Secondary | ICD-10-CM

## 2023-07-16 DIAGNOSIS — M6281 Muscle weakness (generalized): Secondary | ICD-10-CM

## 2023-07-23 ENCOUNTER — Encounter: Payer: Self-pay | Admitting: Physical Therapy

## 2023-07-23 ENCOUNTER — Ambulatory Visit: Attending: Orthopedic Surgery | Admitting: Physical Therapy

## 2023-07-23 DIAGNOSIS — R29898 Other symptoms and signs involving the musculoskeletal system: Secondary | ICD-10-CM | POA: Diagnosis present

## 2023-07-23 DIAGNOSIS — M6281 Muscle weakness (generalized): Secondary | ICD-10-CM | POA: Insufficient documentation

## 2023-07-23 DIAGNOSIS — M5459 Other low back pain: Secondary | ICD-10-CM | POA: Insufficient documentation

## 2023-07-23 DIAGNOSIS — G8929 Other chronic pain: Secondary | ICD-10-CM | POA: Insufficient documentation

## 2023-07-23 DIAGNOSIS — M25512 Pain in left shoulder: Secondary | ICD-10-CM | POA: Diagnosis present

## 2023-07-23 NOTE — Therapy (Signed)
 OUTPATIENT PHYSICAL THERAPY THORACOLUMBAR TREATMENT   Patient Name: [name] MRN: [id number] DOB:[birth year], 53 y.o., female Today's Date: 07/23/2023  END OF SESSION:  PT End of Session - 07/23/23 1514     Visit Number 10    Number of Visits 9    Date for PT Re-Evaluation 07/23/23    Authorization Type Aetna state    Authorization Time Period 06/18/23 to 07/23/23    PT Start Time 1515    PT Stop Time 1600    PT Time Calculation (min) 45 min                   Past Medical History:  Diagnosis Date   Adrenal tumor    "monitoring"   Anemia    hx of   Arthritis    Enlarged thyroid    "all tests are normal"   GERD (gastroesophageal reflux disease)    with motrin   Heart murmur    Obesity, Class III, BMI 40-49.9 (morbid obesity)    Pneumonia    hx of   Sleep apnea    Past Surgical History:  Procedure Laterality Date   ABDOMINAL HYSTERECTOMY  03/2003   partial   CHOLECYSTECTOMY  2012   LIPOMA EXCISION  01/11/2010   TOTAL KNEE ARTHROPLASTY Bilateral 08/02/2013   Procedure: TOTAL KNEE BILATERAL;  Surgeon: Bevin Bucks, MD;  Location: [facility];  Service: Orthopedics;  Laterality: Bilateral;   TUBAL LIGATION     Patient Active Problem List   Diagnosis Date Noted   Status post total bilateral knee replacement 08/05/2013   Expected blood loss anemia 08/04/2013   S/P bilateral TKA 08/02/2013   Morbid obesity (HCC) 10/12/2010   Hypertension 10/12/2010   RUQ PAIN 05/04/2010   EPIGASTRIC PAIN 05/04/2010    PCP: Jearlean Mince PA-C   REFERRING PROVIDER: Maryln Sober, PA-C  REFERRING DIAG: Free Text Diagnosis M54.41 Rt Side Low back pain w/ Sciatica  Rationale for Evaluation and Treatment: Rehabilitation  THERAPY DIAG:  Chronic left shoulder pain  Other symptoms and signs involving the musculoskeletal system  Muscle weakness (generalized)  Other low back pain  ONSET DATE: 2 weeks ago this past Sunday   SUBJECTIVE:                                                                                                                                                                                            SUBJECTIVE STATEMENT: Hurt low back during workout class last week. She   PERTINENT HISTORY:   See above   PAIN:  Are you having pain? Yes: NPRS scale: 4/10 Lt shoulder  Pain  location: L shoulder, R knee  Pain description: ache/barely there  Aggravating factors: sitting  Relieving factors: movement, not sitting   PRECAUTIONS: None  RED FLAGS: None   WEIGHT BEARING RESTRICTIONS: No  FALLS:  Has patient fallen in last 6 months? No  LIVING ENVIRONMENT: Lives with: lives with their spouse   OCCUPATION: Runner, broadcasting/film/video- first grade   PLOF: Independent, Independent with basic ADLs, Independent with gait, and Independent with transfers  PATIENT GOALS: be able exercise without this flaring up, address shoulder if we can   NEXT MD VISIT: not scheduled   OBJECTIVE:  Note: Objective measures were completed at Evaluation unless otherwise noted.  DIAGNOSTIC FINDINGS:    PATIENT SURVEYS:  Modified Oswestry 10%   COGNITION: Overall cognitive status: Within functional limits for tasks assessed     SENSATION: Not tested  MUSCLE LENGTH:  Hamstrings mod limitations B Piriformis mod limitations B, L>R  L LE about 3/4 cm shorter than R LE  POSTURE: rounded shoulders, forward head, and increased thoracic kyphosis    LUMBAR ROM:   AROM eval 05/21/23  Flexion WNL; RFIS  WNL  Extension Mild limitation, REIS  WNL  Right lateral flexion Mild limitation  WFL  Left lateral flexion Mild limitation  WFL  Right rotation Mod limitation  WNL  Left rotation Mod limitation  WNL   (Blank rows = not tested)    LOWER EXTREMITY MMT:    MMT Right eval Left eval  Hip flexion 4+ 4-  Hip extension    Hip abduction 5 4+  Hip adduction    Hip internal rotation    Hip external rotation    Knee flexion 5 5  Knee extension 5 5   Ankle dorsiflexion 5 5  Ankle plantarflexion    Ankle inversion    Ankle eversion     (Blank rows = not tested)  LUMBAR SPECIAL TESTS:  Straight leg raise test: Negative Sciatic flossing negative B      TREATMENT DATE:  07/23/23 UBE L4 5# Wood chopper 2x10- slow pace 5# cable lateral raise 2x10 5# around the [place] 2x5 Lats 25# 2x12 Seated row 2x12 25# Bridge x10, SL x10 each leg Feet on ball  Side to side x10  HS curl x10 Hip flexion, HS, piriformis stretch    07/16/23 UBE L3 x24mins each way  Wall push ups x10  Seated row 25# 2x10 Lat pull downs 25# 2x10 Cable front raise 5# 2x10 Cable lateral raise 5# 2x10 Single arm lat pull with cable 5# 2x10 Taping to L shoulder- 3 pieces of tape to unload sore shoulder, no pull at anchors, 100% pull in the middle   07/10/23 Started with TP DN by MAlbright PT UBE L 4 2 min each way Shoulder ext 10#  2 sets 10 Cable row 15# 2x12 Wood chopper 10# x10 each ER 5# 2x10  7# shld shrug circles x10 each way  Blue band pull apart 2 sets 10 Massage gun upper traps -    07/03/23 Massage gun upper traps - UBE 3 Min each way Yellow ball chest press 5# cable wood chopper 2x10 each side 6# shoulder shrug rolls fwd/back 2x10 each way Shoulder abd 5# cables x8, 4# db x10 - cue to engage core to avoid lateral bending  Cable shoulder ext 10# 2x10 Cable shoulder front raise 5# 2x10     06/18/23 UBE L3 x29mins each way   Trigger Point Dry Needling  Initial Treatment: Pt instructed on Dry Needling rational, procedures, and  possible side effects. Pt instructed to expect mild to moderate muscle soreness later in the day and/or into the next day.  Pt instructed in methods to reduce muscle soreness. Pt instructed to continue prescribed HEP. Patient was educated on signs and symptoms of infection and other risk factors and advised to seek medical attention should they occur.  Patient verbalized understanding of these  instructions and education.   Patient Verbal Consent Given: Yes Education Handout Provided: Yes Muscles Treated: bilateral upper traps, bilateral infraspinatus  Treatment Response/Outcome: LTR's Performed and documented by A Speaks, PT, DPT   Rotations with cable 10# 2x10 AR press 10# 2x10 Moist heat to L upper trap x8 mins    06/11/23 NuStep L5x97mins  Shoulder extension 10# 2x10 Woodchops with yellow ball 2x10 Shoulder shrugs 2x10 Arm circles  UT stretch with 5# weight 30s  Trigger Point Dry Needling  Initial Treatment: Pt instructed on Dry Needling rational, procedures, and possible side effects. Pt instructed to expect mild to moderate muscle soreness later in the day and/or into the next day.  Pt instructed in methods to reduce muscle soreness. Pt instructed to continue prescribed HEP. Patient was educated on signs and symptoms of infection and other risk factors and advised to seek medical attention should they occur.  Patient verbalized understanding of these instructions and education.   Patient Verbal Consent Given: Yes Education Handout Provided: Yes Muscles Treated: bilateral upper traps Treatment Response/Outcome: LTR's Performed and documented by Arminda Landmark, PT  06/04/23 Treadmill walk on incline 2.5% x21mins UBE L1 x60mins  STM, manual TP release, some use of theragun, and stretching to L upper trap  UT stretch with 4# weight  Pec stretch in doorway 15s x2 Back ext with 15# cable 2x10  Hip hinge with 20# KB  05/28/23 Walking outdoors Bike L4 x72mins Shoulder ext 10# 2x10 AR press 10# 2x10 blackTB crunches 2x10 Leg press 40# 2x10 Modified crunches 2x10 Seated rotations with yellow ball 2x10 Standing holding ball in front marching    05/21/23 Recheck goals  NuStep L5x52mins Feet on pball rotations, knees to chest, small bridges x10 LE supine stretches SKTC, HS, IT band  SLR 2x10 AB roll up with ball 2x10  Modified deadbug with LE only 2x10  alternating    04/24/23  Exam, care planning, HEP   SKTC 6x5 seconds B Lumbar 6x5 seconds B QL stretch from chair 1x30 seconds B                                                                             PATIENT EDUCATION:  Education details: exam findings, POC, HEP  Person educated: Patient Education method: Programmer, multimedia, Demonstration, Verbal cues, and Handouts Education comprehension: verbalized understanding, returned demonstration, verbal cues required, and needs further education  HOME EXERCISE PROGRAM: Access Code: [email or web address] URL: [email or web address] Date: 04/24/2023 Prepared by: Terrel Ferries  Exercises - Supine Single Knee to Chest Stretch  - 2 x daily - 7 x weekly - 1 sets - 6 reps - 5 seconds  hold - Supine Lower Trunk Rotation  - 2 x daily - 7 x weekly - 1 sets - 6 reps - 5 seconds  hold - Seated Quadratus Lumborum Stretch in Chair  -  2 x daily - 7 x weekly - 1 sets - 2 reps - 30 seconds  hold  ASSESSMENT:  CLINICAL IMPRESSION: Pt arrived stating she is getting better overall but still having some pain in L shoulder and upper trap. She also had increased LBP from her exercise class. She is pleased with the progress she has made so far. She would benefit from further PT to improve core strength and address radiating pain in L shoulder.   OBJECTIVE IMPAIRMENTS: decreased activity tolerance, decreased ROM, decreased strength, increased fascial restrictions, increased muscle spasms, postural dysfunction, obesity, and pain.   ACTIVITY LIMITATIONS: carrying, lifting, sitting, standing, squatting, and locomotion level  PARTICIPATION LIMITATIONS: meal prep, cleaning, laundry, community activity, occupation, and yard work  PERSONAL FACTORS: Age, Behavior pattern, Education, Fitness, Past/current experiences, and Time since onset of injury/illness/exacerbation are also affecting patient's functional outcome.   REHAB POTENTIAL: Good  CLINICAL DECISION  MAKING: Stable/uncomplicated  EVALUATION COMPLEXITY: Low   GOALS: Goals reviewed with patient? No  SHORT TERM GOALS: Target date: 05/22/2023    Will be compliant with appropriate progressive HEP  Baseline: Goal status: MET 05/21/23  2.  Lumbar ROM to have normalized and will be painfree  Baseline:  Goal status: MET 05/21/23  3.  LE mm flexibility to be no more than 25% limited  Baseline:  Goal status: MET 05/21/23 "the HS issue is much better and I feel less tight"  4.  Shoulder w/u to have been completed and HEP given  Baseline:  Goal status: full ROM, 5/5 strength L shoulder  MET    LONG TERM GOALS: Target date: 07/23/23    MMT in all weak groups to have improved to 5/5  Baseline:  Goal status: MET 06/18/23  2.  Will be compliant with scheduled rest days along with regular exercise schedule  Baseline:  Goal status: ongoing 05/21/23, MET 06/18/23  3.  Pain to have resolved in shoulder and back/hips  Baseline:  Goal status: ongoing "lumbar pain once in a while" 05/21/23, no pain in back or hamstrings, still having some upper trap pain 06/18/23   07/10/23 progressing and resolving  4.  Oswestry to be 0% limited  Baseline:  Goal status: MET 06/18/23  5.  Will be able to perform all desired functional tasks in gym and at home without difficulty  Baseline:  Goal status: ongoing 05/21/23, ongoing 06/18/23   progressing 07/10/23    PLAN:  PT FREQUENCY: 1-2x/week  PT DURATION: 8 weeks  PLANNED INTERVENTIONS: 97164- PT Re-evaluation, 97110-Therapeutic exercises, 97530- Therapeutic activity, 97535- Self Care, 08657- Manual therapy, 97760- Orthotic Fit/training, [id number]- Ionotophoresis 4mg /ml Dexamethasone , Taping, Dry Needling, Cryotherapy, and Moist heat.  PLAN FOR NEXT SESSION: 10th visit progress note and recert if goals are not met and if she wants to continue    Patient Details  Name: [name] MRN: [id number] Date of Birth: 1970/07/17 Referring Provider:  Claiborne Crew,  MD  Encounter Date: 07/23/2023   Laurelyn Ponder, SPTA 07/23/2023, 3:15 PM  [facility] [place] [facility] [address] [place], [place], [address] Phone: [phone]   Fax:  [phone] at [facility] [address]. [place], [place], [address] Phone: [phone]   Fax:  [phone]
